# Patient Record
Sex: Male | Born: 1955 | Race: White | Hispanic: No | Marital: Married | Smoking: Former smoker
Health system: Southern US, Community
[De-identification: ages and names within clinical notes are randomized; demographics above are authoritative.]

## PROBLEM LIST (undated history)

## (undated) DIAGNOSIS — D649 Anemia, unspecified: Secondary | ICD-10-CM

## (undated) DIAGNOSIS — K219 Gastro-esophageal reflux disease without esophagitis: Secondary | ICD-10-CM

## (undated) DIAGNOSIS — B999 Unspecified infectious disease: Secondary | ICD-10-CM

## (undated) HISTORY — PX: OTHER SURGICAL HISTORY: SHX169

## (undated) HISTORY — PX: EYE SURGERY: SHX253

---

## 2013-11-07 ENCOUNTER — Emergency Department (HOSPITAL_COMMUNITY): Payer: BC Managed Care – PPO

## 2013-11-07 ENCOUNTER — Inpatient Hospital Stay (HOSPITAL_COMMUNITY)
Admission: EM | Admit: 2013-11-07 | Discharge: 2013-11-15 | DRG: 964 | Disposition: A | Discharge status: Skilled Nursing Facility | Payer: BC Managed Care – PPO | Attending: General Surgery | Admitting: General Surgery

## 2013-11-07 ENCOUNTER — Encounter (HOSPITAL_COMMUNITY): Payer: Self-pay | Admitting: Emergency Medicine

## 2013-11-07 DIAGNOSIS — D62 Acute posthemorrhagic anemia: Secondary | ICD-10-CM | POA: Diagnosis not present

## 2013-11-07 DIAGNOSIS — K560 Paralytic ileus: Secondary | ICD-10-CM | POA: Diagnosis not present

## 2013-11-07 DIAGNOSIS — S0100XA Unspecified open wound of scalp, initial encounter: Secondary | ICD-10-CM | POA: Diagnosis present

## 2013-11-07 DIAGNOSIS — S62319A Displaced fracture of base of unspecified metacarpal bone, initial encounter for closed fracture: Secondary | ICD-10-CM | POA: Diagnosis present

## 2013-11-07 DIAGNOSIS — S42401A Unspecified fracture of lower end of right humerus, initial encounter for closed fracture: Secondary | ICD-10-CM | POA: Diagnosis present

## 2013-11-07 DIAGNOSIS — S61411A Laceration without foreign body of right hand, initial encounter: Secondary | ICD-10-CM | POA: Diagnosis present

## 2013-11-07 DIAGNOSIS — S32402A Unspecified fracture of left acetabulum, initial encounter for closed fracture: Secondary | ICD-10-CM

## 2013-11-07 DIAGNOSIS — Z87891 Personal history of nicotine dependence: Secondary | ICD-10-CM

## 2013-11-07 DIAGNOSIS — S01309A Unspecified open wound of unspecified ear, initial encounter: Secondary | ICD-10-CM | POA: Diagnosis present

## 2013-11-07 DIAGNOSIS — S060X9A Concussion with loss of consciousness of unspecified duration, initial encounter: Secondary | ICD-10-CM | POA: Diagnosis present

## 2013-11-07 DIAGNOSIS — S91309A Unspecified open wound, unspecified foot, initial encounter: Secondary | ICD-10-CM | POA: Diagnosis present

## 2013-11-07 DIAGNOSIS — S52123A Displaced fracture of head of unspecified radius, initial encounter for closed fracture: Secondary | ICD-10-CM | POA: Diagnosis present

## 2013-11-07 DIAGNOSIS — T07XXXA Unspecified multiple injuries, initial encounter: Secondary | ICD-10-CM

## 2013-11-07 DIAGNOSIS — S61209A Unspecified open wound of unspecified finger without damage to nail, initial encounter: Secondary | ICD-10-CM | POA: Diagnosis present

## 2013-11-07 DIAGNOSIS — S2231XA Fracture of one rib, right side, initial encounter for closed fracture: Secondary | ICD-10-CM | POA: Diagnosis present

## 2013-11-07 DIAGNOSIS — S32409A Unspecified fracture of unspecified acetabulum, initial encounter for closed fracture: Principal | ICD-10-CM | POA: Diagnosis present

## 2013-11-07 DIAGNOSIS — F411 Generalized anxiety disorder: Secondary | ICD-10-CM | POA: Diagnosis not present

## 2013-11-07 DIAGNOSIS — S060XAA Concussion with loss of consciousness status unknown, initial encounter: Secondary | ICD-10-CM | POA: Diagnosis present

## 2013-11-07 DIAGNOSIS — E875 Hyperkalemia: Secondary | ICD-10-CM | POA: Diagnosis present

## 2013-11-07 DIAGNOSIS — S62306A Unspecified fracture of fifth metacarpal bone, right hand, initial encounter for closed fracture: Secondary | ICD-10-CM | POA: Diagnosis present

## 2013-11-07 DIAGNOSIS — S270XXA Traumatic pneumothorax, initial encounter: Secondary | ICD-10-CM | POA: Diagnosis present

## 2013-11-07 DIAGNOSIS — S2241XA Multiple fractures of ribs, right side, initial encounter for closed fracture: Secondary | ICD-10-CM

## 2013-11-07 DIAGNOSIS — T148XXA Other injury of unspecified body region, initial encounter: Secondary | ICD-10-CM

## 2013-11-07 DIAGNOSIS — Z800 Family history of malignant neoplasm of digestive organs: Secondary | ICD-10-CM

## 2013-11-07 DIAGNOSIS — S52043A Displaced fracture of coronoid process of unspecified ulna, initial encounter for closed fracture: Secondary | ICD-10-CM | POA: Diagnosis present

## 2013-11-07 DIAGNOSIS — S92009A Unspecified fracture of unspecified calcaneus, initial encounter for closed fracture: Secondary | ICD-10-CM | POA: Diagnosis present

## 2013-11-07 DIAGNOSIS — S0101XA Laceration without foreign body of scalp, initial encounter: Secondary | ICD-10-CM | POA: Diagnosis present

## 2013-11-07 DIAGNOSIS — S42209A Unspecified fracture of upper end of unspecified humerus, initial encounter for closed fracture: Secondary | ICD-10-CM | POA: Diagnosis present

## 2013-11-07 DIAGNOSIS — S92001A Unspecified fracture of right calcaneus, initial encounter for closed fracture: Secondary | ICD-10-CM | POA: Diagnosis present

## 2013-11-07 DIAGNOSIS — S91312A Laceration without foreign body, left foot, initial encounter: Secondary | ICD-10-CM | POA: Diagnosis present

## 2013-11-07 DIAGNOSIS — R000 Tachycardia, unspecified: Secondary | ICD-10-CM | POA: Diagnosis not present

## 2013-11-07 DIAGNOSIS — S2239XA Fracture of one rib, unspecified side, initial encounter for closed fracture: Secondary | ICD-10-CM | POA: Diagnosis present

## 2013-11-07 DIAGNOSIS — S73005A Unspecified dislocation of left hip, initial encounter: Secondary | ICD-10-CM

## 2013-11-07 HISTORY — DX: Gastro-esophageal reflux disease without esophagitis: K21.9

## 2013-11-07 HISTORY — DX: Unspecified infectious disease: B99.9

## 2013-11-07 LAB — I-STAT CHEM 8, ED
BUN: 13 mg/dL (ref 6–23)
CALCIUM ION: 1.11 mmol/L — AB (ref 1.12–1.23)
CREATININE: 1.4 mg/dL — AB (ref 0.50–1.35)
Chloride: 103 mEq/L (ref 96–112)
GLUCOSE: 229 mg/dL — AB (ref 70–99)
HCT: 45 % (ref 39.0–52.0)
HEMOGLOBIN: 15.3 g/dL (ref 13.0–17.0)
Potassium: 3.2 mEq/L — ABNORMAL LOW (ref 3.7–5.3)
SODIUM: 136 meq/L — AB (ref 137–147)
TCO2: 20 mmol/L (ref 0–100)

## 2013-11-07 LAB — CBC
HEMATOCRIT: 42.9 % (ref 39.0–52.0)
Hemoglobin: 15.2 g/dL (ref 13.0–17.0)
MCH: 31.5 pg (ref 26.0–34.0)
MCHC: 35.4 g/dL (ref 30.0–36.0)
MCV: 88.8 fL (ref 78.0–100.0)
PLATELETS: 277 10*3/uL (ref 150–400)
RBC: 4.83 MIL/uL (ref 4.22–5.81)
RDW: 12.9 % (ref 11.5–15.5)
WBC: 16.5 10*3/uL — AB (ref 4.0–10.5)

## 2013-11-07 LAB — PROTIME-INR
INR: 1.02 (ref 0.00–1.49)
Prothrombin Time: 13.4 seconds (ref 11.6–15.2)

## 2013-11-07 LAB — SAMPLE TO BLOOD BANK

## 2013-11-07 LAB — COMPREHENSIVE METABOLIC PANEL
ALBUMIN: 3.9 g/dL (ref 3.5–5.2)
ALT: 75 U/L — ABNORMAL HIGH (ref 0–53)
ANION GAP: 23 — AB (ref 5–15)
AST: 80 U/L — ABNORMAL HIGH (ref 0–37)
Alkaline Phosphatase: 92 U/L (ref 39–117)
BILIRUBIN TOTAL: 0.4 mg/dL (ref 0.3–1.2)
BUN: 13 mg/dL (ref 6–23)
CHLORIDE: 97 meq/L (ref 96–112)
CO2: 17 mEq/L — ABNORMAL LOW (ref 19–32)
CREATININE: 1.32 mg/dL (ref 0.50–1.35)
Calcium: 9.6 mg/dL (ref 8.4–10.5)
GFR calc Af Amer: 67 mL/min — ABNORMAL LOW (ref >90)
GFR calc non Af Amer: 57 mL/min — ABNORMAL LOW (ref >90)
GLUCOSE: 222 mg/dL — AB (ref 70–99)
Potassium: 3.4 mEq/L — ABNORMAL LOW (ref 3.7–5.3)
Sodium: 137 mEq/L (ref 137–147)
TOTAL PROTEIN: 6.9 g/dL (ref 6.0–8.3)

## 2013-11-07 LAB — CBG MONITORING, ED: Glucose-Capillary: 192 mg/dL — ABNORMAL HIGH (ref 70–99)

## 2013-11-07 LAB — CDS SEROLOGY

## 2013-11-07 LAB — ETHANOL: Alcohol, Ethyl (B): <11 mg/dL (ref 0–11)

## 2013-11-07 MED ORDER — ONDANSETRON HCL 4 MG/2ML IJ SOLN
4.0000 mg | Freq: Once | INTRAMUSCULAR | Status: AC
  Administered 2013-11-07: 4 mg via INTRAVENOUS
  Filled 2013-11-07: qty 2

## 2013-11-07 MED ORDER — TETANUS-DIPHTH-ACELL PERTUSSIS 5-2.5-18.5 LF-MCG/0.5 IM SUSP
0.5000 mL | Freq: Once | INTRAMUSCULAR | Status: AC
  Administered 2013-11-07: 0.5 mL via INTRAMUSCULAR
  Filled 2013-11-07: qty 0.5

## 2013-11-07 MED ORDER — IOHEXOL 300 MG/ML  SOLN
100.0000 mL | Freq: Once | INTRAMUSCULAR | Status: AC | PRN
  Administered 2013-11-07: 100 mL via INTRAVENOUS

## 2013-11-07 MED ORDER — HYDROMORPHONE HCL PF 1 MG/ML IJ SOLN
0.5000 mg | Freq: Once | INTRAMUSCULAR | Status: AC
  Administered 2013-11-07: 0.5 mg via INTRAVENOUS
  Filled 2013-11-07: qty 1

## 2013-11-07 MED ORDER — HYDROMORPHONE HCL PF 1 MG/ML IJ SOLN
1.0000 mg | Freq: Once | INTRAMUSCULAR | Status: AC
  Administered 2013-11-07: 1 mg via INTRAVENOUS
  Filled 2013-11-07: qty 1

## 2013-11-07 MED ORDER — FENTANYL CITRATE 0.05 MG/ML IJ SOLN
100.0000 ug | Freq: Once | INTRAMUSCULAR | Status: AC
  Administered 2013-11-07: 100 ug via INTRAVENOUS
  Filled 2013-11-07: qty 2
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
## 2013-11-07 NOTE — ED Notes (Signed)
Pt placed on 2L [place] due to O2 sats dropping to 88% on RA.

## 2013-11-07 NOTE — Progress Notes (Signed)
Chaplain responded to trauma page. Pt alert and responsive and more coherent as conversation progressed. Asked Chaplain to notify daughter Toniann FailWendy and gentleman named Air cabin crewMatt [place] [employer]. Toniann FailWendy was reached and is making her way. Matt could not be reached. Concerned for his wife, Chaplain relayed messages between he and his wife. He was grateful for that action.  Gala RomneyLarry J Brown

## 2013-11-07 NOTE — ED Notes (Signed)
EDP at bedside suturing  
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
## 2013-11-07 NOTE — ED Notes (Signed)
Portable at bedside for repeat portable pelvis Xray.
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
## 2013-11-07 NOTE — ED Notes (Signed)
Old blood bank bracelet cut off due to arm needing to be splinted.  New blood bank bracelet placed, new blood tube drawn and pink slip filled out.  Blood bank notified.

## 2013-11-07 NOTE — ED Notes (Signed)
Traction applied to pt by Dr. Eulah PontMurphy.

## 2013-11-07 NOTE — ED Notes (Signed)
Suture cart at bedside 

## 2013-11-07 NOTE — Progress Notes (Signed)
Orthopedic Tech Progress Note Patient Details:  Ria BushKenneth Neely 08/16/1955 [id number]  Musculoskeletal Traction Type of Traction: Skeletal (Balanced Suspension) Traction Location: LLE Traction Weight: 20 lbs    Jennye MoccasinHughes, Kyvon Hu Craig 11/07/2013, 11:01 PM

## 2013-11-07 NOTE — ED Provider Notes (Signed)
LACERATION REPAIR Performed by: Dorthula MatasGREENE,Pascual Mantel G Authorized by: Dorthula MatasGREENE,Dalvin Clipper G Consent: Verbal consent obtained. Risks and benefits: risks, benefits and alternatives were discussed Consent given by: patient Patient identity confirmed: provided demographic data Prepped and Draped in normal sterile fashion Wound explored  Laceration Location: scalp  Laceration Length: 1.5 cm  No Foreign Bodies seen or palpated  Anesthesia: local infiltration  Local anesthetic: lidocaine 2% with epinephrine  Anesthetic total: 1.5 ml  Irrigation method: syringe Amount of cleaning: standard  Skin closure: staples  Number of sutures: 3  Technique: staples  Patient tolerance: Patient tolerated the procedure well with no immediate complications.  LACERATION REPAIR Performed by: Dorthula MatasGREENE,Julio Zappia G Authorized by: Dorthula MatasGREENE,Dudley Mages G Consent: Verbal consent obtained. Risks and benefits: risks, benefits and alternatives were discussed Consent given by: patient Patient identity confirmed: provided demographic data Prepped and Draped in normal sterile fashion Wound explored  Laceration Location: left foot  Laceration Length:  1.5 cm  No Foreign Bodies seen or palpated  Anesthesia: local infiltration  Local anesthetic: lidocaine 2  % with epinephrine  Anesthetic total: 1.5  ml  Irrigation method: syringe Amount of cleaning: standard  Skin closure: sutures  Number of sutures: 3   Technique: simple interrupted  Patient tolerance: Patient tolerated the procedure well with no immediate complications.  For HPI, ROS, PE, and Dispo of this visit please refer to Dr. Exie ParodyScott Goldstons note.  Dorthula Matasiffany G Sakai Heinle, PA-C 11/07/13 2358

## 2013-11-08 ENCOUNTER — Inpatient Hospital Stay (HOSPITAL_COMMUNITY): Payer: BC Managed Care – PPO

## 2013-11-08 ENCOUNTER — Encounter (HOSPITAL_COMMUNITY): Payer: Self-pay | Admitting: General Practice

## 2013-11-08 ENCOUNTER — Encounter (HOSPITAL_COMMUNITY): Payer: BC Managed Care – PPO | Admitting: Certified Registered Nurse Anesthetist

## 2013-11-08 ENCOUNTER — Inpatient Hospital Stay (HOSPITAL_COMMUNITY): Payer: BC Managed Care – PPO | Admitting: Certified Registered Nurse Anesthetist

## 2013-11-08 DIAGNOSIS — S62319A Displaced fracture of base of unspecified metacarpal bone, initial encounter for closed fracture: Secondary | ICD-10-CM | POA: Diagnosis present

## 2013-11-08 DIAGNOSIS — S52043A Displaced fracture of coronoid process of unspecified ulna, initial encounter for closed fracture: Secondary | ICD-10-CM | POA: Diagnosis present

## 2013-11-08 DIAGNOSIS — S32402A Unspecified fracture of left acetabulum, initial encounter for closed fracture: Secondary | ICD-10-CM | POA: Diagnosis present

## 2013-11-08 DIAGNOSIS — S91309A Unspecified open wound, unspecified foot, initial encounter: Secondary | ICD-10-CM | POA: Diagnosis present

## 2013-11-08 DIAGNOSIS — S2231XA Fracture of one rib, right side, initial encounter for closed fracture: Secondary | ICD-10-CM | POA: Diagnosis present

## 2013-11-08 DIAGNOSIS — R000 Tachycardia, unspecified: Secondary | ICD-10-CM | POA: Diagnosis not present

## 2013-11-08 DIAGNOSIS — S060X9A Concussion with loss of consciousness of unspecified duration, initial encounter: Secondary | ICD-10-CM | POA: Diagnosis present

## 2013-11-08 DIAGNOSIS — S0101XA Laceration without foreign body of scalp, initial encounter: Secondary | ICD-10-CM | POA: Diagnosis present

## 2013-11-08 DIAGNOSIS — S32409A Unspecified fracture of unspecified acetabulum, initial encounter for closed fracture: Secondary | ICD-10-CM | POA: Diagnosis present

## 2013-11-08 DIAGNOSIS — S61411A Laceration without foreign body of right hand, initial encounter: Secondary | ICD-10-CM | POA: Diagnosis present

## 2013-11-08 DIAGNOSIS — S52123A Displaced fracture of head of unspecified radius, initial encounter for closed fracture: Secondary | ICD-10-CM | POA: Diagnosis present

## 2013-11-08 DIAGNOSIS — S01309A Unspecified open wound of unspecified ear, initial encounter: Secondary | ICD-10-CM | POA: Diagnosis present

## 2013-11-08 DIAGNOSIS — S62306A Unspecified fracture of fifth metacarpal bone, right hand, initial encounter for closed fracture: Secondary | ICD-10-CM | POA: Diagnosis present

## 2013-11-08 DIAGNOSIS — F411 Generalized anxiety disorder: Secondary | ICD-10-CM | POA: Diagnosis not present

## 2013-11-08 DIAGNOSIS — S61209A Unspecified open wound of unspecified finger without damage to nail, initial encounter: Secondary | ICD-10-CM | POA: Diagnosis present

## 2013-11-08 DIAGNOSIS — S2239XA Fracture of one rib, unspecified side, initial encounter for closed fracture: Secondary | ICD-10-CM

## 2013-11-08 DIAGNOSIS — S0100XA Unspecified open wound of scalp, initial encounter: Secondary | ICD-10-CM | POA: Diagnosis present

## 2013-11-08 DIAGNOSIS — S91312A Laceration without foreign body, left foot, initial encounter: Secondary | ICD-10-CM | POA: Diagnosis present

## 2013-11-08 DIAGNOSIS — S270XXA Traumatic pneumothorax, initial encounter: Secondary | ICD-10-CM | POA: Diagnosis present

## 2013-11-08 DIAGNOSIS — S0180XA Unspecified open wound of other part of head, initial encounter: Secondary | ICD-10-CM

## 2013-11-08 DIAGNOSIS — S060XAA Concussion with loss of consciousness status unknown, initial encounter: Secondary | ICD-10-CM | POA: Diagnosis present

## 2013-11-08 DIAGNOSIS — D62 Acute posthemorrhagic anemia: Secondary | ICD-10-CM | POA: Diagnosis not present

## 2013-11-08 DIAGNOSIS — Z800 Family history of malignant neoplasm of digestive organs: Secondary | ICD-10-CM | POA: Diagnosis not present

## 2013-11-08 DIAGNOSIS — Z87891 Personal history of nicotine dependence: Secondary | ICD-10-CM | POA: Diagnosis not present

## 2013-11-08 DIAGNOSIS — S42209A Unspecified fracture of upper end of unspecified humerus, initial encounter for closed fracture: Secondary | ICD-10-CM | POA: Diagnosis present

## 2013-11-08 DIAGNOSIS — E875 Hyperkalemia: Secondary | ICD-10-CM | POA: Diagnosis present

## 2013-11-08 DIAGNOSIS — T148XXA Other injury of unspecified body region, initial encounter: Secondary | ICD-10-CM | POA: Diagnosis present

## 2013-11-08 DIAGNOSIS — S92009A Unspecified fracture of unspecified calcaneus, initial encounter for closed fracture: Secondary | ICD-10-CM | POA: Diagnosis present

## 2013-11-08 DIAGNOSIS — K560 Paralytic ileus: Secondary | ICD-10-CM | POA: Diagnosis not present

## 2013-11-08 LAB — CBC
HCT: 40.6 % (ref 39.0–52.0)
Hemoglobin: 14.1 g/dL (ref 13.0–17.0)
MCH: 30.2 pg (ref 26.0–34.0)
MCHC: 34.7 g/dL (ref 30.0–36.0)
MCV: 86.9 fL (ref 78.0–100.0)
PLATELETS: 218 10*3/uL (ref 150–400)
RBC: 4.67 MIL/uL (ref 4.22–5.81)
RDW: 13.1 % (ref 11.5–15.5)
WBC: 14.5 10*3/uL — AB (ref 4.0–10.5)

## 2013-11-08 LAB — URINALYSIS, ROUTINE W REFLEX MICROSCOPIC
BILIRUBIN URINE: NEGATIVE
GLUCOSE, UA: NEGATIVE mg/dL
Ketones, ur: NEGATIVE mg/dL
Leukocytes, UA: NEGATIVE
NITRITE: NEGATIVE
PH: 5.5 (ref 5.0–8.0)
Protein, ur: NEGATIVE mg/dL
SPECIFIC GRAVITY, URINE: 1.03 (ref 1.005–1.030)
Urobilinogen, UA: 0.2 mg/dL (ref 0.0–1.0)

## 2013-11-08 LAB — BASIC METABOLIC PANEL
ANION GAP: 18 — AB (ref 5–15)
Anion gap: 15 (ref 5–15)
Anion gap: 14 (ref 5–15)
BUN: 18 mg/dL (ref 6–23)
BUN: 20 mg/dL (ref 6–23)
BUN: 18 mg/dL (ref 6–23)
CALCIUM: 8.5 mg/dL (ref 8.4–10.5)
CALCIUM: 8.9 mg/dL (ref 8.4–10.5)
CHLORIDE: 102 meq/L (ref 96–112)
CO2: 19 mEq/L (ref 19–32)
CO2: 15 mEq/L — ABNORMAL LOW (ref 19–32)
CO2: 22 mEq/L (ref 19–32)
CREATININE: 1.17 mg/dL (ref 0.50–1.35)
Calcium: 9.3 mg/dL (ref 8.4–10.5)
Chloride: 103 mEq/L (ref 96–112)
Chloride: 103 mEq/L (ref 96–112)
Creatinine, Ser: 1.34 mg/dL (ref 0.50–1.35)
Creatinine, Ser: 1.12 mg/dL (ref 0.50–1.35)
GFR calc Af Amer: 65 mL/min — ABNORMAL LOW (ref >90)
GFR calc Af Amer: 77 mL/min — ABNORMAL LOW (ref >90)
GFR calc Af Amer: 81 mL/min — ABNORMAL LOW (ref >90)
GFR, EST NON AFRICAN AMERICAN: 56 mL/min — AB (ref >90)
GFR, EST NON AFRICAN AMERICAN: 67 mL/min — AB (ref >90)
GFR, EST NON AFRICAN AMERICAN: 70 mL/min — AB (ref >90)
GLUCOSE: 169 mg/dL — AB (ref 70–99)
Glucose, Bld: 152 mg/dL — ABNORMAL HIGH (ref 70–99)
Glucose, Bld: 85 mg/dL (ref 70–99)
POTASSIUM: 6 meq/L — AB (ref 3.7–5.3)
Potassium: 6.2 mEq/L — ABNORMAL HIGH (ref 3.7–5.3)
Potassium: 4.1 mEq/L (ref 3.7–5.3)
SODIUM: 136 meq/L — AB (ref 137–147)
SODIUM: 139 meq/L (ref 137–147)
Sodium: 136 mEq/L — ABNORMAL LOW (ref 137–147)

## 2013-11-08 LAB — GLUCOSE, CAPILLARY: Glucose-Capillary: 115 mg/dL — ABNORMAL HIGH (ref 70–99)

## 2013-11-08 LAB — URINE MICROSCOPIC-ADD ON

## 2013-11-08 MED ORDER — POTASSIUM CHLORIDE IN NACL 20-0.9 MEQ/L-% IV SOLN
INTRAVENOUS | Status: DC
  Administered 2013-11-08: 04:00:00 via INTRAVENOUS
  Filled 2013-11-08 (×3): qty 1000

## 2013-11-08 MED ORDER — HYDROMORPHONE HCL PF 1 MG/ML IJ SOLN
1.0000 mg | INTRAMUSCULAR | Status: DC | PRN
  Administered 2013-11-08 – 2013-11-11 (×4): 2 mg via INTRAVENOUS
  Administered 2013-11-13 (×2): 1 mg via INTRAVENOUS
  Filled 2013-11-08: qty 1
  Filled 2013-11-08 (×4): qty 2

## 2013-11-08 MED ORDER — MIDAZOLAM HCL 2 MG/2ML IJ SOLN
INTRAMUSCULAR | Status: AC
  Filled 2013-11-08: qty 2

## 2013-11-08 MED ORDER — MIDAZOLAM HCL 2 MG/2ML IJ SOLN
INTRAMUSCULAR | Status: DC | PRN
  Administered 2013-11-08 (×2): 1 mg via INTRAVENOUS

## 2013-11-08 MED ORDER — FENTANYL CITRATE 0.05 MG/ML IJ SOLN
INTRAMUSCULAR | Status: AC
  Filled 2013-11-08: qty 2

## 2013-11-08 MED ORDER — LIDOCAINE HCL (CARDIAC) 20 MG/ML IV SOLN
INTRAVENOUS | Status: AC
  Filled 2013-11-08: qty 5

## 2013-11-08 MED ORDER — METHOCARBAMOL 500 MG PO TABS
500.0000 mg | ORAL_TABLET | Freq: Four times a day (QID) | ORAL | Status: DC | PRN
  Administered 2013-11-08 (×2): 1000 mg via ORAL
  Filled 2013-11-08 (×2): qty 2

## 2013-11-08 MED ORDER — SODIUM CHLORIDE 0.9 % IJ SOLN: 9.0000 mL | INTRAMUSCULAR | Status: DC | PRN

## 2013-11-08 MED ORDER — PANTOPRAZOLE SODIUM 40 MG IV SOLR
40.0000 mg | Freq: Every day | INTRAVENOUS | Status: DC
  Filled 2013-11-08: qty 40

## 2013-11-08 MED ORDER — ONDANSETRON HCL 4 MG/2ML IJ SOLN
4.0000 mg | Freq: Four times a day (QID) | INTRAMUSCULAR | Status: DC | PRN
  Administered 2013-11-08: 4 mg via INTRAVENOUS
  Filled 2013-11-08 (×2): qty 2

## 2013-11-08 MED ORDER — MORPHINE SULFATE 2 MG/ML IJ SOLN
2.0000 mg | INTRAMUSCULAR | Status: DC | PRN
  Administered 2013-11-08 – 2013-11-12 (×2): 2 mg via INTRAVENOUS
  Filled 2013-11-08 (×2): qty 1

## 2013-11-08 MED ORDER — POLYETHYLENE GLYCOL 3350 17 G PO PACK
17.0000 g | PACK | Freq: Every day | ORAL | Status: DC
  Administered 2013-11-08 – 2013-11-12 (×4): 17 g via ORAL
  Filled 2013-11-08 (×9): qty 1

## 2013-11-08 MED ORDER — SULFAMETHOXAZOLE-TMP DS 800-160 MG PO TABS
1.0000 | ORAL_TABLET | Freq: Two times a day (BID) | ORAL | Status: DC
  Administered 2013-11-08 – 2013-11-15 (×12): 1 via ORAL
  Filled 2013-11-08 (×17): qty 1

## 2013-11-08 MED ORDER — MEPERIDINE HCL 25 MG/ML IJ SOLN: 6.2500 mg | INTRAMUSCULAR | Status: DC | PRN

## 2013-11-08 MED ORDER — INSULIN ASPART 100 UNIT/ML IV SOLN
10.0000 [IU] | Freq: Once | INTRAVENOUS | Status: AC
  Administered 2013-11-08: 10 [IU] via INTRAVENOUS
  Filled 2013-11-08: qty 0.1

## 2013-11-08 MED ORDER — PROPOFOL 10 MG/ML IV BOLUS
INTRAVENOUS | Status: AC
  Filled 2013-11-08: qty 20

## 2013-11-08 MED ORDER — OXYCODONE HCL 5 MG/5ML PO SOLN
5.0000 mg | Freq: Once | ORAL | Status: DC | PRN
Start: 2013-11-08 — End: 2013-11-08

## 2013-11-08 MED ORDER — FENTANYL CITRATE 0.05 MG/ML IJ SOLN
100.0000 ug | Freq: Once | INTRAMUSCULAR | Status: AC
  Administered 2013-11-08: 100 ug via INTRAVENOUS

## 2013-11-08 MED ORDER — PROMETHAZINE HCL 25 MG/ML IJ SOLN: 6.2500 mg | INTRAMUSCULAR | Status: DC | PRN

## 2013-11-08 MED ORDER — DIPHENHYDRAMINE HCL 12.5 MG/5ML PO ELIX
12.5000 mg | ORAL_SOLUTION | Freq: Four times a day (QID) | ORAL | Status: DC | PRN
  Filled 2013-11-08: qty 5

## 2013-11-08 MED ORDER — ENOXAPARIN SODIUM 40 MG/0.4ML ~~LOC~~ SOLN
40.0000 mg | SUBCUTANEOUS | Status: DC
  Administered 2013-11-10 – 2013-11-14 (×5): 40 mg via SUBCUTANEOUS
  Filled 2013-11-08 (×8): qty 0.4

## 2013-11-08 MED ORDER — FENTANYL 10 MCG/ML IV SOLN
INTRAVENOUS | Status: DC
  Administered 2013-11-08: 75 ug via INTRAVENOUS
  Administered 2013-11-08: 15:00:00 via INTRAVENOUS
  Administered 2013-11-08: 120 ug via INTRAVENOUS
  Administered 2013-11-09 (×2): via INTRAVENOUS
  Administered 2013-11-09: 165 ug via INTRAVENOUS
  Administered 2013-11-10: 45 ug via INTRAVENOUS
  Administered 2013-11-10: 90 ug via INTRAVENOUS
  Administered 2013-11-10: 30 ug via INTRAVENOUS
  Administered 2013-11-10: 75 ug via INTRAVENOUS
  Filled 2013-11-08 (×2): qty 50

## 2013-11-08 MED ORDER — ROCURONIUM BROMIDE 50 MG/5ML IV SOLN
INTRAVENOUS | Status: AC
  Filled 2013-11-08: qty 1

## 2013-11-08 MED ORDER — HYDROMORPHONE HCL PF 1 MG/ML IJ SOLN: 0.2500 mg | INTRAMUSCULAR | Status: DC | PRN

## 2013-11-08 MED ORDER — HYDROMORPHONE HCL PF 1 MG/ML IJ SOLN
1.0000 mg | INTRAMUSCULAR | Status: DC | PRN
  Administered 2013-11-08 (×3): 1 mg via INTRAVENOUS
  Filled 2013-11-08 (×3): qty 1

## 2013-11-08 MED ORDER — BACITRACIN ZINC 500 UNIT/GM EX OINT
TOPICAL_OINTMENT | Freq: Two times a day (BID) | CUTANEOUS | Status: DC
  Administered 2013-11-08 – 2013-11-11 (×6): via TOPICAL
  Administered 2013-11-12: 1 via TOPICAL
  Administered 2013-11-12 – 2013-11-15 (×5): via TOPICAL
  Filled 2013-11-08 (×2): qty 28.35

## 2013-11-08 MED ORDER — DIAZEPAM 5 MG/ML IJ SOLN
INTRAMUSCULAR | Status: AC
  Filled 2013-11-08: qty 2

## 2013-11-08 MED ORDER — FENTANYL CITRATE 0.05 MG/ML IJ SOLN
25.0000 ug | Freq: Once | INTRAMUSCULAR | Status: AC
  Administered 2013-11-08: 25 ug via INTRAVENOUS

## 2013-11-08 MED ORDER — DIAZEPAM 5 MG/ML IJ SOLN
10.0000 mg | Freq: Four times a day (QID) | INTRAMUSCULAR | Status: DC | PRN
  Administered 2013-11-08: 10 mg via INTRAVENOUS

## 2013-11-08 MED ORDER — NALOXONE HCL 0.4 MG/ML IJ SOLN: 0.4000 mg | INTRAMUSCULAR | Status: DC | PRN

## 2013-11-08 MED ORDER — HYDROMORPHONE HCL PF 1 MG/ML IJ SOLN
INTRAMUSCULAR | Status: AC
  Filled 2013-11-08: qty 2

## 2013-11-08 MED ORDER — FENTANYL CITRATE 0.05 MG/ML IJ SOLN
INTRAMUSCULAR | Status: DC | PRN
  Administered 2013-11-08: 50 ug via INTRAVENOUS

## 2013-11-08 MED ORDER — DOCUSATE SODIUM 100 MG PO CAPS
100.0000 mg | ORAL_CAPSULE | Freq: Two times a day (BID) | ORAL | Status: DC
  Administered 2013-11-08 – 2013-11-14 (×9): 100 mg via ORAL
  Filled 2013-11-08 (×17): qty 1

## 2013-11-08 MED ORDER — LACTATED RINGERS IV SOLN
INTRAVENOUS | Status: DC
  Administered 2013-11-08 (×2): via INTRAVENOUS
  Administered 2013-11-10: 50 mL/h via INTRAVENOUS

## 2013-11-08 MED ORDER — ONDANSETRON HCL 4 MG/2ML IJ SOLN
INTRAMUSCULAR | Status: AC
  Filled 2013-11-08: qty 2

## 2013-11-08 MED ORDER — ONDANSETRON HCL 4 MG PO TABS
4.0000 mg | ORAL_TABLET | Freq: Four times a day (QID) | ORAL | Status: DC | PRN
  Administered 2013-11-12: 4 mg via ORAL
  Filled 2013-11-08 (×2): qty 1

## 2013-11-08 MED ORDER — METHOCARBAMOL 1000 MG/10ML IJ SOLN
500.0000 mg | Freq: Four times a day (QID) | INTRAVENOUS | Status: DC | PRN
  Filled 2013-11-08: qty 10

## 2013-11-08 MED ORDER — FENTANYL CITRATE 0.05 MG/ML IJ SOLN
INTRAMUSCULAR | Status: AC
  Filled 2013-11-08: qty 5

## 2013-11-08 MED ORDER — CIPROFLOXACIN HCL 500 MG PO TABS
500.0000 mg | ORAL_TABLET | Freq: Two times a day (BID) | ORAL | Status: DC
  Administered 2013-11-08 – 2013-11-15 (×13): 500 mg via ORAL
  Filled 2013-11-08 (×18): qty 1

## 2013-11-08 MED ORDER — DIAZEPAM 5 MG/ML IJ SOLN
5.0000 mg | Freq: Four times a day (QID) | INTRAMUSCULAR | Status: DC | PRN
  Administered 2013-11-08: 5 mg via INTRAVENOUS
  Administered 2013-11-09 – 2013-11-13 (×5): 10 mg via INTRAVENOUS
  Filled 2013-11-08 (×7): qty 2

## 2013-11-08 MED ORDER — SODIUM CHLORIDE 0.9 % IV SOLN
1.0000 g | Freq: Once | INTRAVENOUS | Status: AC
  Administered 2013-11-08: 1 g via INTRAVENOUS
  Filled 2013-11-08: qty 10

## 2013-11-08 MED ORDER — DEXTROSE 10 % IV BOLUS
500.0000 mL | Freq: Once | INTRAVENOUS | Status: AC
  Administered 2013-11-08: 15:00:00 via INTRAVENOUS

## 2013-11-08 MED ORDER — PANTOPRAZOLE SODIUM 40 MG PO TBEC: 40.0000 mg | DELAYED_RELEASE_TABLET | Freq: Every day | ORAL | Status: DC

## 2013-11-08 MED ORDER — OXYCODONE HCL 5 MG PO TABS: 5.0000 mg | ORAL_TABLET | Freq: Once | ORAL | Status: DC | PRN

## 2013-11-08 MED ORDER — DIPHENHYDRAMINE HCL 50 MG/ML IJ SOLN
12.5000 mg | Freq: Four times a day (QID) | INTRAMUSCULAR | Status: DC | PRN
  Administered 2013-11-10: 12.5 mg via INTRAVENOUS
  Filled 2013-11-08: qty 1

## 2013-11-08 MED ORDER — OXYCODONE HCL 5 MG PO TABS
5.0000 mg | ORAL_TABLET | ORAL | Status: DC | PRN
  Administered 2013-11-08: 10 mg via ORAL
  Administered 2013-11-09: 5 mg via ORAL
  Administered 2013-11-11 – 2013-11-15 (×12): 15 mg via ORAL
  Administered 2013-11-15: 10 mg via ORAL
  Filled 2013-11-08 (×3): qty 3
  Filled 2013-11-08: qty 1
  Filled 2013-11-08 (×6): qty 3
  Filled 2013-11-08: qty 2
  Filled 2013-11-08: qty 3
  Filled 2013-11-08: qty 2
  Filled 2013-11-08 (×2): qty 3

## 2013-11-08 NOTE — ED Notes (Signed)
Dr Wyatt at bedside.  
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
## 2013-11-08 NOTE — Consult Note (Signed)
  Patient has minimally displaced right small metacarpal fracture  No surgical intervention needed at this time  Please place in ulnar gutter splint with follow up in my office in 7-10 days

## 2013-11-08 NOTE — ED Provider Notes (Signed)
I was present for the lacerations repaired by PA Tiffany Derek [place]  Wardell Pokorski T Khila Papp, MD 11/08/13 [phone]
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
## 2013-11-08 NOTE — Anesthesia Preprocedure Evaluation (Addendum)
Anesthesia Evaluation  Patient identified by MRN, date of birth, ID band Patient awake    Reviewed: Allergy & Precautions, H&P , NPO status , Patient's Chart, lab work & pertinent test results  Airway Mallampati: II TM Distance: >3 FB Neck ROM: Full    Dental no notable dental hx.    Pulmonary former smoker,  breath sounds clear to auscultation  Pulmonary exam normal       Cardiovascular negative cardio ROS  Rhythm:Regular Rate:Normal     Neuro/Psych CT head and neck negative for intracranial or cervical injury negative neurological ROS  negative psych ROS   GI/Hepatic Neg liver ROS, GERD-  Medicated,  Endo/Other  diabetes, Type 2Possibly stress related. Glc 220s  Renal/GU negative Renal ROS     Musculoskeletal negative musculoskeletal ROS (+)   Abdominal   Peds  Hematology negative hematology ROS (+)   Anesthesia Other Findings   Reproductive/Obstetrics                       Anesthesia Physical Anesthesia Plan  ASA: II  Anesthesia Plan: General   Post-op Pain Management:    Induction: Intravenous  Airway Management Planned: Oral ETT  Additional Equipment:   Intra-op Plan:   Post-operative Plan: Extubation in OR  Informed Consent: I have reviewed the patients History and Physical, chart, labs and discussed the procedure including the risks, benefits and alternatives for the proposed anesthesia with the patient or authorized representative who has indicated his/her understanding and acceptance.   Dental advisory given  Plan Discussed with: CRNA  Anesthesia Plan Comments:        Anesthesia Quick Evaluation

## 2013-11-08 NOTE — Progress Notes (Addendum)
Pt. Cancelled per Dr. Carola FrostHandy due to elevated K.  Calcium started per Collie SiadM. McMillan, CRNA.  [facility].  Pt transferred on bed with traction in place to Left leg.  Cardiac monitor in place during Short stay admission and during transport.
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
## 2013-11-08 NOTE — Consult Note (Signed)
I have seen and examined the patient. I agree with the findings above.  Left acetabular fracture with persistent dislocation secondary to incarcerated fragment LLE with intact sens and motor function of sciatic  Given the complexity of the fracture pattern, Dr. Eulah PontMurphy asserted this was outside his scope of practice and that it would be in the best interest of the patient to have these injuries evaluated and treated by a fellowship trained orthopaedic traumatologist.  I discussed with the patient and his daughter the risks and benefits of surgery, including the possibility of infection, nerve injury, vessel injury, wound breakdown, arthritis, symptomatic hardware, DVT/ PE, loss of motion, and need for further surgery among others.  We also specifically discussed the elevated risk of AVN, HO, instability, and foot drop.  They understood these risks and wished to proceed.    Jerry PalmerHANDY,Jerry Sikora H, MD 11/08/2013 12:31 PM

## 2013-11-08 NOTE — Progress Notes (Signed)
Appreciate Dr. Magdalene PatriciaHandy's help. Repeat K - likely an error. Patient examined and I agree with the assessment and plan  Violeta GelinasBurke Earnestine Shipp, MD, MPH, FACS Trauma: [phone] General Surgery: [phone]  11/08/2013 10:39 AM

## 2013-11-09 ENCOUNTER — Inpatient Hospital Stay (HOSPITAL_COMMUNITY): Payer: BC Managed Care – PPO

## 2013-11-09 ENCOUNTER — Encounter (HOSPITAL_COMMUNITY): Payer: BC Managed Care – PPO

## 2013-11-09 ENCOUNTER — Encounter (HOSPITAL_COMMUNITY): Payer: Self-pay | Admitting: Anesthesiology

## 2013-11-09 ENCOUNTER — Encounter (HOSPITAL_COMMUNITY): Admission: EM | Disposition: A | Discharge status: Skilled Nursing Facility | Payer: Self-pay | Source: Home / Self Care

## 2013-11-09 DIAGNOSIS — D62 Acute posthemorrhagic anemia: Secondary | ICD-10-CM

## 2013-11-09 HISTORY — PX: ORIF ACETABULAR FRACTURE: SHX5029

## 2013-11-09 HISTORY — PX: CAST APPLICATION: SHX380

## 2013-11-09 LAB — BASIC METABOLIC PANEL
Anion gap: 11 (ref 5–15)
Anion gap: 11 (ref 5–15)
BUN: 16 mg/dL (ref 6–23)
BUN: 10 mg/dL (ref 6–23)
CALCIUM: 7.6 mg/dL — AB (ref 8.4–10.5)
CO2: 23 mEq/L (ref 19–32)
CO2: 23 meq/L (ref 19–32)
Calcium: 8.6 mg/dL (ref 8.4–10.5)
Chloride: 102 mEq/L (ref 96–112)
Chloride: 100 mEq/L (ref 96–112)
Creatinine, Ser: 1.1 mg/dL (ref 0.50–1.35)
Creatinine, Ser: 1.06 mg/dL (ref 0.50–1.35)
GFR calc Af Amer: 83 mL/min — ABNORMAL LOW (ref >90)
GFR calc Af Amer: 87 mL/min — ABNORMAL LOW (ref >90)
GFR calc non Af Amer: 72 mL/min — ABNORMAL LOW (ref >90)
GFR calc non Af Amer: 75 mL/min — ABNORMAL LOW (ref >90)
GLUCOSE: 121 mg/dL — AB (ref 70–99)
Glucose, Bld: 115 mg/dL — ABNORMAL HIGH (ref 70–99)
POTASSIUM: 4.3 meq/L (ref 3.7–5.3)
Potassium: 4.6 mEq/L (ref 3.7–5.3)
SODIUM: 134 meq/L — AB (ref 137–147)
Sodium: 136 mEq/L — ABNORMAL LOW (ref 137–147)

## 2013-11-09 LAB — CBC
HCT: 21.7 % — ABNORMAL LOW (ref 39.0–52.0)
HEMATOCRIT: 32.1 % — AB (ref 39.0–52.0)
HEMATOCRIT: 22.6 % — AB (ref 39.0–52.0)
HEMOGLOBIN: 8 g/dL — AB (ref 13.0–17.0)
Hemoglobin: 11.4 g/dL — ABNORMAL LOW (ref 13.0–17.0)
Hemoglobin: 7.8 g/dL — ABNORMAL LOW (ref 13.0–17.0)
MCH: 31.1 pg (ref 26.0–34.0)
MCH: 32 pg (ref 26.0–34.0)
MCH: 30.3 pg (ref 26.0–34.0)
MCHC: 35.5 g/dL (ref 30.0–36.0)
MCHC: 35.9 g/dL (ref 30.0–36.0)
MCHC: 35.4 g/dL (ref 30.0–36.0)
MCV: 87.7 fL (ref 78.0–100.0)
MCV: 88.9 fL (ref 78.0–100.0)
MCV: 85.6 fL (ref 78.0–100.0)
Platelets: 156 10*3/uL (ref 150–400)
Platelets: 150 10*3/uL (ref 150–400)
Platelets: 137 10*3/uL — ABNORMAL LOW (ref 150–400)
RBC: 3.66 MIL/uL — ABNORMAL LOW (ref 4.22–5.81)
RBC: 2.44 MIL/uL — ABNORMAL LOW (ref 4.22–5.81)
RBC: 2.64 MIL/uL — AB (ref 4.22–5.81)
RDW: 13.1 % (ref 11.5–15.5)
RDW: 13 % (ref 11.5–15.5)
RDW: 13 % (ref 11.5–15.5)
WBC: 8.3 10*3/uL (ref 4.0–10.5)
WBC: 7.3 10*3/uL (ref 4.0–10.5)
WBC: 7 10*3/uL (ref 4.0–10.5)

## 2013-11-09 LAB — PROTIME-INR
INR: 1.19 (ref 0.00–1.49)
Prothrombin Time: 15.1 seconds (ref 11.6–15.2)

## 2013-11-09 LAB — ABO/RH: ABO/RH(D): B POS

## 2013-11-09 LAB — PREPARE RBC (CROSSMATCH)

## 2013-11-09 LAB — APTT: aPTT: 28 seconds (ref 24–37)

## 2013-11-09 LAB — MRSA PCR SCREENING: MRSA BY PCR: NEGATIVE

## 2013-11-09 SURGERY — OPEN REDUCTION INTERNAL FIXATION (ORIF) ACETABULAR FRACTURE
Anesthesia: General | Laterality: Left

## 2013-11-09 SURGERY — OPEN REDUCTION INTERNAL FIXATION (ORIF) ACETABULAR FRACTURE
Anesthesia: General | Site: Ankle | Laterality: Right

## 2013-11-09 MED ORDER — ROCURONIUM BROMIDE 50 MG/5ML IV SOLN
INTRAVENOUS | Status: AC
  Filled 2013-11-09: qty 1

## 2013-11-09 MED ORDER — LACTATED RINGERS IV SOLN
INTRAVENOUS | Status: DC | PRN
  Administered 2013-11-09 (×3): via INTRAVENOUS

## 2013-11-09 MED ORDER — SODIUM CHLORIDE 0.9 % IV SOLN
Freq: Once | INTRAVENOUS | Status: AC
  Administered 2013-11-10: via INTRAVENOUS

## 2013-11-09 MED ORDER — ONDANSETRON HCL 4 MG/2ML IJ SOLN
INTRAMUSCULAR | Status: AC
  Filled 2013-11-09: qty 2

## 2013-11-09 MED ORDER — PHENYLEPHRINE HCL 10 MG/ML IJ SOLN
INTRAMUSCULAR | Status: DC | PRN
  Administered 2013-11-09: 80 ug via INTRAVENOUS
  Administered 2013-11-09: 120 ug via INTRAVENOUS

## 2013-11-09 MED ORDER — FENTANYL CITRATE 0.05 MG/ML IJ SOLN
INTRAMUSCULAR | Status: AC
  Filled 2013-11-09: qty 2

## 2013-11-09 MED ORDER — ARTIFICIAL TEARS OP OINT
TOPICAL_OINTMENT | OPHTHALMIC | Status: DC | PRN
  Administered 2013-11-09: 1 via OPHTHALMIC

## 2013-11-09 MED ORDER — LIDOCAINE HCL (CARDIAC) 20 MG/ML IV SOLN
INTRAVENOUS | Status: AC
  Filled 2013-11-09: qty 5

## 2013-11-09 MED ORDER — PROPOFOL 10 MG/ML IV BOLUS
INTRAVENOUS | Status: DC | PRN
  Administered 2013-11-09: 200 mg via INTRAVENOUS

## 2013-11-09 MED ORDER — ALBUMIN HUMAN 5 % IV SOLN
INTRAVENOUS | Status: DC | PRN
  Administered 2013-11-09 (×2): via INTRAVENOUS

## 2013-11-09 MED ORDER — FENTANYL CITRATE 0.05 MG/ML IJ SOLN
100.0000 ug | Freq: Once | INTRAMUSCULAR | Status: AC
  Administered 2013-11-09: 100 ug via INTRAVENOUS

## 2013-11-09 MED ORDER — WHITE PETROLATUM GEL
Status: AC
  Administered 2013-11-09: 16:00:00
  Filled 2013-11-09: qty 5

## 2013-11-09 MED ORDER — ESMOLOL HCL 10 MG/ML IV SOLN
INTRAVENOUS | Status: DC | PRN
  Administered 2013-11-09: 10 mg via INTRAVENOUS

## 2013-11-09 MED ORDER — OXYCODONE HCL 5 MG/5ML PO SOLN: 5.0000 mg | Freq: Once | ORAL | Status: DC | PRN

## 2013-11-09 MED ORDER — GLYCOPYRROLATE 0.2 MG/ML IJ SOLN
INTRAMUSCULAR | Status: AC
  Filled 2013-11-09: qty 3

## 2013-11-09 MED ORDER — PROPOFOL 10 MG/ML IV BOLUS
INTRAVENOUS | Status: AC
Start: 2013-11-09 — End: 2013-11-09
  Filled 2013-11-09: qty 20

## 2013-11-09 MED ORDER — SODIUM CHLORIDE 0.9 % IV SOLN
500.0000 mL | Freq: Once | INTRAVENOUS | Status: AC
  Administered 2013-11-09: 500 mL via INTRAVENOUS

## 2013-11-09 MED ORDER — MIDAZOLAM HCL 5 MG/5ML IJ SOLN
INTRAMUSCULAR | Status: DC | PRN
  Administered 2013-11-09 (×2): 1 mg via INTRAVENOUS

## 2013-11-09 MED ORDER — OXYCODONE HCL 5 MG PO TABS: 5.0000 mg | ORAL_TABLET | Freq: Once | ORAL | Status: DC | PRN

## 2013-11-09 MED ORDER — PROPOFOL 10 MG/ML IV BOLUS
INTRAVENOUS | Status: AC
  Filled 2013-11-09: qty 20

## 2013-11-09 MED ORDER — FENTANYL CITRATE 0.05 MG/ML IJ SOLN
INTRAMUSCULAR | Status: AC
  Filled 2013-11-09: qty 5

## 2013-11-09 MED ORDER — ROCURONIUM BROMIDE 50 MG/5ML IV SOLN
INTRAVENOUS | Status: AC
  Filled 2013-11-09: qty 2

## 2013-11-09 MED ORDER — FENTANYL CITRATE 0.05 MG/ML IJ SOLN
INTRAMUSCULAR | Status: AC
  Administered 2013-11-09: 100 ug via INTRAVENOUS
  Filled 2013-11-09: qty 2

## 2013-11-09 MED ORDER — SUCCINYLCHOLINE CHLORIDE 20 MG/ML IJ SOLN
INTRAMUSCULAR | Status: AC
  Filled 2013-11-09: qty 1

## 2013-11-09 MED ORDER — NEOSTIGMINE METHYLSULFATE 10 MG/10ML IV SOLN
INTRAVENOUS | Status: DC | PRN
  Administered 2013-11-09: 4 mg via INTRAVENOUS

## 2013-11-09 MED ORDER — FENTANYL CITRATE 0.05 MG/ML IJ SOLN
INTRAMUSCULAR | Status: DC | PRN
  Administered 2013-11-09 (×6): 50 ug via INTRAVENOUS
  Administered 2013-11-09 (×2): 100 ug via INTRAVENOUS
  Administered 2013-11-09 (×2): 50 ug via INTRAVENOUS
  Administered 2013-11-09: 150 ug via INTRAVENOUS
  Administered 2013-11-09: 50 ug via INTRAVENOUS

## 2013-11-09 MED ORDER — ROCURONIUM BROMIDE 100 MG/10ML IV SOLN
INTRAVENOUS | Status: DC | PRN
  Administered 2013-11-09: 10 mg via INTRAVENOUS
  Administered 2013-11-09 (×2): 20 mg via INTRAVENOUS
  Administered 2013-11-09: 30 mg via INTRAVENOUS
  Administered 2013-11-09: 50 mg via INTRAVENOUS

## 2013-11-09 MED ORDER — CEFAZOLIN SODIUM-DEXTROSE 2-3 GM-% IV SOLR
INTRAVENOUS | Status: AC
  Administered 2013-11-09: 2 g via INTRAVENOUS
  Filled 2013-11-09: qty 50

## 2013-11-09 MED ORDER — PHENYLEPHRINE 40 MCG/ML (10ML) SYRINGE FOR IV PUSH (FOR BLOOD PRESSURE SUPPORT)
PREFILLED_SYRINGE | INTRAVENOUS | Status: AC
  Filled 2013-11-09: qty 10

## 2013-11-09 MED ORDER — HYDROMORPHONE HCL PF 1 MG/ML IJ SOLN
INTRAMUSCULAR | Status: AC
  Filled 2013-11-09: qty 1

## 2013-11-09 MED ORDER — LIDOCAINE HCL (CARDIAC) 20 MG/ML IV SOLN
INTRAVENOUS | Status: DC | PRN
  Administered 2013-11-09: 80 mg via INTRAVENOUS

## 2013-11-09 MED ORDER — NEOSTIGMINE METHYLSULFATE 10 MG/10ML IV SOLN
INTRAVENOUS | Status: AC
  Filled 2013-11-09: qty 1

## 2013-11-09 MED ORDER — ONDANSETRON HCL 4 MG/2ML IJ SOLN
INTRAMUSCULAR | Status: DC | PRN
  Administered 2013-11-09: 4 mg via INTRAVENOUS

## 2013-11-09 MED ORDER — STERILE WATER FOR INJECTION IJ SOLN
INTRAMUSCULAR | Status: AC
  Filled 2013-11-09: qty 10

## 2013-11-09 MED ORDER — MIDAZOLAM HCL 2 MG/2ML IJ SOLN
INTRAMUSCULAR | Status: AC
  Filled 2013-11-09: qty 2

## 2013-11-09 MED ORDER — GLYCOPYRROLATE 0.2 MG/ML IJ SOLN
INTRAMUSCULAR | Status: DC | PRN
  Administered 2013-11-09: 0.6 mg via INTRAVENOUS

## 2013-11-09 MED ORDER — PROMETHAZINE HCL 25 MG/ML IJ SOLN
6.2500 mg | INTRAMUSCULAR | Status: DC | PRN
Start: 2013-11-09 — End: 2013-11-09

## 2013-11-09 MED ORDER — CEFAZOLIN SODIUM-DEXTROSE 2-3 GM-% IV SOLR
2.0000 g | Freq: Once | INTRAVENOUS | Status: DC
  Administered 2013-11-22: 2 g via INTRAVENOUS

## 2013-11-09 MED ORDER — EPHEDRINE SULFATE 50 MG/ML IJ SOLN
INTRAMUSCULAR | Status: AC
  Filled 2013-11-09: qty 1

## 2013-11-09 MED ORDER — ARTIFICIAL TEARS OP OINT
TOPICAL_OINTMENT | OPHTHALMIC | Status: AC
  Filled 2013-11-09: qty 3.5

## 2013-11-09 MED ORDER — FENTANYL CITRATE 0.05 MG/ML IJ SOLN
100.0000 ug | Freq: Once | INTRAMUSCULAR | Status: DC
  Administered 2013-11-09 (×2): 50 ug via INTRAVENOUS

## 2013-11-09 MED ORDER — CEFAZOLIN SODIUM 1-5 GM-% IV SOLN
1.0000 g | Freq: Three times a day (TID) | INTRAVENOUS | Status: AC
  Administered 2013-11-09 – 2013-11-10 (×3): 1 g via INTRAVENOUS
  Filled 2013-11-09 (×3): qty 50

## 2013-11-09 MED ORDER — HYDROMORPHONE HCL PF 1 MG/ML IJ SOLN
0.2500 mg | INTRAMUSCULAR | Status: DC | PRN
  Administered 2013-11-09: 0.5 mg via INTRAVENOUS

## 2013-11-09 SURGICAL SUPPLY — 81 items
APPLIER CLIP 11 MED OPEN (CLIP)
BIT DRILL AO MATTA 2.5MX230M (BIT) ×2 IMPLANT
BIT DRILL TWST MATTA 4.5MX6.5M (BIT) ×2 IMPLANT
BLADE SURG ROTATE 9660 (MISCELLANEOUS) IMPLANT
BONE CHIP PRESERV 20CC (Bone Implant) IMPLANT
BRUSH SCRUB DISP (MISCELLANEOUS) ×8 IMPLANT
CLIP APPLIE 11 MED OPEN (CLIP) IMPLANT
CLOSURE WOUND 1/2 X4 (GAUZE/BANDAGES/DRESSINGS) ×1
COVER SURGICAL LIGHT HANDLE (MISCELLANEOUS) ×4 IMPLANT
DRAIN CHANNEL 10F 3/8 F FF (DRAIN) IMPLANT
DRAIN CHANNEL 15F RND FF W/TCR (WOUND CARE) IMPLANT
DRAPE C-ARM 42X72 X-RAY (DRAPES) IMPLANT
DRAPE C-ARMOR (DRAPES) ×4 IMPLANT
DRAPE INCISE IOBAN 66X45 STRL (DRAPES) ×4 IMPLANT
DRAPE INCISE IOBAN 85X60 (DRAPES) ×8 IMPLANT
DRAPE ORTHO SPLIT 77X108 STRL (DRAPES) ×4
DRAPE SURG ORHT 6 SPLT 77X108 (DRAPES) ×4 IMPLANT
DRAPE U-SHAPE 47X51 STRL (DRAPES) ×4 IMPLANT
DRILL BIT AO MATTA 2.5MX230M (BIT) ×4
DRILL TWIST MATTA 4.5MX6.5M (BIT) ×4
DRSG ADAPTIC 3X8 NADH LF (GAUZE/BANDAGES/DRESSINGS) IMPLANT
DRSG MEPILEX BORDER 4X12 (GAUZE/BANDAGES/DRESSINGS) ×4 IMPLANT
DRSG PAD ABDOMINAL 8X10 ST (GAUZE/BANDAGES/DRESSINGS) ×8 IMPLANT
ELECT BLADE 6.5 EXT (BLADE) IMPLANT
ELECT REM PT RETURN 9FT ADLT (ELECTROSURGICAL) ×4
ELECTRODE REM PT RTRN 9FT ADLT (ELECTROSURGICAL) ×2 IMPLANT
EVACUATOR 1/8 PVC DRAIN (DRAIN) IMPLANT
EVACUATOR SILICONE 100CC (DRAIN) ×4 IMPLANT
GAUZE SPONGE 4X4 12PLY STRL (GAUZE/BANDAGES/DRESSINGS) ×4 IMPLANT
GAUZE SPONGE 4X4 16PLY XRAY LF (GAUZE/BANDAGES/DRESSINGS) ×4 IMPLANT
GLOVE BIO SURGEON STRL SZ7.5 (GLOVE) ×4 IMPLANT
GLOVE BIO SURGEON STRL SZ8 (GLOVE) ×4 IMPLANT
GLOVE BIOGEL PI IND STRL 7.5 (GLOVE) ×2 IMPLANT
GLOVE BIOGEL PI IND STRL 8 (GLOVE) ×2 IMPLANT
GLOVE BIOGEL PI INDICATOR 7.5 (GLOVE) ×2
GLOVE BIOGEL PI INDICATOR 8 (GLOVE) ×2
GOWN STRL REUS W/ TWL LRG LVL3 (GOWN DISPOSABLE) ×4 IMPLANT
GOWN STRL REUS W/ TWL XL LVL3 (GOWN DISPOSABLE) ×2 IMPLANT
GOWN STRL REUS W/TWL 2XL LVL3 (GOWN DISPOSABLE) ×4 IMPLANT
GOWN STRL REUS W/TWL LRG LVL3 (GOWN DISPOSABLE) ×4
GOWN STRL REUS W/TWL XL LVL3 (GOWN DISPOSABLE) ×2
HANDPIECE INTERPULSE COAX TIP (DISPOSABLE)
KIT BASIN OR (CUSTOM PROCEDURE TRAY) ×4 IMPLANT
KIT ROOM TURNOVER OR (KITS) ×4 IMPLANT
LIGHT ORTHO (MISCELLANEOUS) IMPLANT
LOOP VESSEL MAXI BLUE (MISCELLANEOUS) IMPLANT
MANIFOLD NEPTUNE II (INSTRUMENTS) ×4 IMPLANT
NEEDLE MAYO TROCAR (NEEDLE) ×4 IMPLANT
NS IRRIG 1000ML POUR BTL (IV SOLUTION) ×4 IMPLANT
PACK TOTAL JOINT (CUSTOM PROCEDURE TRAY) ×4 IMPLANT
PAD ARMBOARD 7.5X6 YLW CONV (MISCELLANEOUS) ×8 IMPLANT
PIN APEX 6X180MM EXFIX (EXFIX) ×4 IMPLANT
PLATE ACET STRT 94.5M 8H (Plate) ×8 IMPLANT
PLATE FLEX MPS 9H ANNEALED (Plate) ×4 IMPLANT
RETRIEVER SUT HEWSON (MISCELLANEOUS) ×4 IMPLANT
SCREW 3.5X46MM (Screw) ×8 IMPLANT
SCREW CORTEX ST MATTA 3.5X24 (Screw) ×8 IMPLANT
SCREW CORTEX ST MATTA 3.5X28MM (Screw) ×4 IMPLANT
SCREW CORTEX ST MATTA 3.5X32MM (Screw) ×4 IMPLANT
SCREW CORTEX ST MATTA 3.5X34MM (Screw) ×8 IMPLANT
SCREW CORTEX ST MATTA 3.5X38M (Screw) ×8 IMPLANT
SCREW CORTEX ST MATTA 3.5X40MM (Screw) ×4 IMPLANT
SCREW CORTEX ST MATTA 3.5X55MM (Screw) ×12 IMPLANT
SCREW CORTICAL 3.5X42MM (Screw) ×12 IMPLANT
SET HNDPC FAN SPRY TIP SCT (DISPOSABLE) IMPLANT
SPONGE LAP 18X18 X RAY DECT (DISPOSABLE) ×12 IMPLANT
STAPLER VISISTAT 35W (STAPLE) ×4 IMPLANT
STRIP CLOSURE SKIN 1/2X4 (GAUZE/BANDAGES/DRESSINGS) ×3 IMPLANT
SUCTION FRAZIER TIP 10 FR DISP (SUCTIONS) ×4 IMPLANT
SUT FIBERWIRE #2 38 T-5 BLUE (SUTURE) ×8
SUT VIC AB 0 CT1 27 (SUTURE) ×2
SUT VIC AB 0 CT1 27XBRD ANBCTR (SUTURE) ×2 IMPLANT
SUT VIC AB 1 CT1 18XCR BRD 8 (SUTURE) ×2 IMPLANT
SUT VIC AB 1 CT1 8-18 (SUTURE) ×2
SUT VIC AB 2-0 CT1 27 (SUTURE) ×2
SUT VIC AB 2-0 CT1 TAPERPNT 27 (SUTURE) ×2 IMPLANT
SUTURE FIBERWR #2 38 T-5 BLUE (SUTURE) ×4 IMPLANT
TOWEL OR 17X24 6PK STRL BLUE (TOWEL DISPOSABLE) ×4 IMPLANT
TOWEL OR 17X26 10 PK STRL BLUE (TOWEL DISPOSABLE) ×8 IMPLANT
TRAY FOLEY CATH 16FRSI W/METER (SET/KITS/TRAYS/PACK) IMPLANT
WATER STERILE IRR 1000ML POUR (IV SOLUTION) IMPLANT

## 2013-11-09 SURGICAL SUPPLY — 63 items
APPLIER CLIP 11 MED OPEN (CLIP)
BLADE SURG ROTATE 9660 (MISCELLANEOUS) IMPLANT
BRUSH SCRUB DISP (MISCELLANEOUS) ×3 IMPLANT
CLIP APPLIE 11 MED OPEN (CLIP) IMPLANT
CLOSURE WOUND 1/2 X4 (GAUZE/BANDAGES/DRESSINGS)
COVER SURGICAL LIGHT HANDLE (MISCELLANEOUS) ×3 IMPLANT
DRAIN CHANNEL 10F 3/8 F FF (DRAIN) IMPLANT
DRAIN CHANNEL 15F RND FF W/TCR (WOUND CARE) IMPLANT
DRAPE C-ARM 42X72 X-RAY (DRAPES) IMPLANT
DRAPE C-ARMOR (DRAPES) IMPLANT
DRAPE INCISE IOBAN 66X45 STRL (DRAPES) IMPLANT
DRAPE INCISE IOBAN 85X60 (DRAPES) ×3 IMPLANT
DRAPE ORTHO SPLIT 77X108 STRL (DRAPES) ×2
DRAPE SURG ORHT 6 SPLT 77X108 (DRAPES) ×1 IMPLANT
DRAPE U-SHAPE 47X51 STRL (DRAPES) ×3 IMPLANT
DRSG ADAPTIC 3X8 NADH LF (GAUZE/BANDAGES/DRESSINGS) ×3 IMPLANT
DRSG PAD ABDOMINAL 8X10 ST (GAUZE/BANDAGES/DRESSINGS) IMPLANT
ELECT BLADE 6.5 EXT (BLADE) IMPLANT
ELECT REM PT RETURN 9FT ADLT (ELECTROSURGICAL) ×3
ELECTRODE REM PT RTRN 9FT ADLT (ELECTROSURGICAL) ×1 IMPLANT
EVACUATOR 1/8 PVC DRAIN (DRAIN) IMPLANT
EVACUATOR SILICONE 100CC (DRAIN) IMPLANT
GAUZE SPONGE 4X4 12PLY STRL (GAUZE/BANDAGES/DRESSINGS) IMPLANT
GAUZE SPONGE 4X4 16PLY XRAY LF (GAUZE/BANDAGES/DRESSINGS) IMPLANT
GLOVE BIO SURGEON STRL SZ7.5 (GLOVE) ×3 IMPLANT
GLOVE BIO SURGEON STRL SZ8 (GLOVE) ×3 IMPLANT
GLOVE BIOGEL PI IND STRL 7.5 (GLOVE) ×1 IMPLANT
GLOVE BIOGEL PI IND STRL 8 (GLOVE) ×1 IMPLANT
GLOVE BIOGEL PI INDICATOR 7.5 (GLOVE) ×2
GLOVE BIOGEL PI INDICATOR 8 (GLOVE) ×2
GOWN STRL REUS W/ TWL LRG LVL3 (GOWN DISPOSABLE) ×2 IMPLANT
GOWN STRL REUS W/ TWL XL LVL3 (GOWN DISPOSABLE) ×1 IMPLANT
GOWN STRL REUS W/TWL 2XL LVL3 (GOWN DISPOSABLE) IMPLANT
GOWN STRL REUS W/TWL LRG LVL3 (GOWN DISPOSABLE) ×4
GOWN STRL REUS W/TWL XL LVL3 (GOWN DISPOSABLE) ×2
HANDPIECE INTERPULSE COAX TIP (DISPOSABLE)
KIT BASIN OR (CUSTOM PROCEDURE TRAY) ×3 IMPLANT
KIT ROOM TURNOVER OR (KITS) ×3 IMPLANT
LIGHT ORTHO (MISCELLANEOUS) IMPLANT
LOOP VESSEL MAXI BLUE (MISCELLANEOUS) IMPLANT
MANIFOLD NEPTUNE II (INSTRUMENTS) IMPLANT
NEEDLE MAYO TROCAR (NEEDLE) ×3 IMPLANT
NS IRRIG 1000ML POUR BTL (IV SOLUTION) ×3 IMPLANT
PACK TOTAL JOINT (CUSTOM PROCEDURE TRAY) ×3 IMPLANT
PAD ARMBOARD 7.5X6 YLW CONV (MISCELLANEOUS) ×6 IMPLANT
RETRIEVER SUT HEWSON (MISCELLANEOUS) IMPLANT
SET HNDPC FAN SPRY TIP SCT (DISPOSABLE) IMPLANT
SPONGE LAP 18X18 X RAY DECT (DISPOSABLE) IMPLANT
STAPLER VISISTAT 35W (STAPLE) ×3 IMPLANT
STRIP CLOSURE SKIN 1/2X4 (GAUZE/BANDAGES/DRESSINGS) IMPLANT
SUCTION FRAZIER TIP 10 FR DISP (SUCTIONS) ×3 IMPLANT
SUT FIBERWIRE #2 38 T-5 BLUE (SUTURE) ×6
SUT VIC AB 0 CT1 27 (SUTURE) ×4
SUT VIC AB 0 CT1 27XBRD ANBCTR (SUTURE) ×2 IMPLANT
SUT VIC AB 1 CT1 18XCR BRD 8 (SUTURE) ×1 IMPLANT
SUT VIC AB 1 CT1 8-18 (SUTURE) ×2
SUT VIC AB 2-0 CT1 27 (SUTURE) ×4
SUT VIC AB 2-0 CT1 TAPERPNT 27 (SUTURE) ×2 IMPLANT
SUTURE FIBERWR #2 38 T-5 BLUE (SUTURE) ×2 IMPLANT
TOWEL OR 17X24 6PK STRL BLUE (TOWEL DISPOSABLE) ×3 IMPLANT
TOWEL OR 17X26 10 PK STRL BLUE (TOWEL DISPOSABLE) ×6 IMPLANT
TRAY FOLEY CATH 16FRSI W/METER (SET/KITS/TRAYS/PACK) IMPLANT
WATER STERILE IRR 1000ML POUR (IV SOLUTION) IMPLANT

## 2013-11-09 NOTE — Anesthesia Preprocedure Evaluation (Addendum)
Anesthesia Evaluation  Patient identified by MRN, date of birth, ID band Patient awake    Reviewed: Allergy & Precautions, H&P , NPO status , Patient's Chart, lab work & pertinent test results  Airway Mallampati: II TM Distance: >3 FB Neck ROM: Full    Dental no notable dental hx.    Pulmonary former smoker,  breath sounds clear to auscultation  Pulmonary exam normal       Cardiovascular negative cardio ROS  Rhythm:Regular Rate:Normal     Neuro/Psych CT head and neck negative for intracranial or cervical injury negative neurological ROS  negative psych ROS   GI/Hepatic Neg liver ROS, GERD-  Medicated,  Endo/Other  diabetes, Type 2Possibly stress related. Glc 220s  Renal/GU negative Renal ROS     Musculoskeletal negative musculoskeletal ROS (+)   Abdominal   Peds  Hematology negative hematology ROS (+)   Anesthesia Other Findings   Reproductive/Obstetrics                          Anesthesia Physical  Anesthesia Plan  ASA: III  Anesthesia Plan: General   Post-op Pain Management:    Induction: Intravenous  Airway Management Planned: Oral ETT  Additional Equipment:   Intra-op Plan:   Post-operative Plan: Extubation in OR  Informed Consent: I have reviewed the patients History and Physical, chart, labs and discussed the procedure including the risks, benefits and alternatives for the proposed anesthesia with the patient or authorized representative who has indicated his/her understanding and acceptance.   Dental advisory given  Plan Discussed with: CRNA  Anesthesia Plan Comments:        Anesthesia Quick Evaluation
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
## 2013-11-09 NOTE — Transfer of Care (Signed)
Immediate Anesthesia Transfer of Care Note  Patient: Jerry BushKenneth Holland  Procedure(s) Performed: Procedure(s): OPEN REDUCTION INTERNAL FIXATION (ORIF) ACETABULAR FRACTURE (Left)  Patient Location: PACU and Short Stay  Anesthesia Type:case cancelled due to K+  Level of Consciousness: awake, alert , oriented and patient cooperative  Airway & Oxygen Therapy: Patient Spontanous Breathing and Patient connected to nasal cannula oxygen  Post-op Assessment: Report given to PACU RN, Post -op Vital signs reviewed and stable and Patient moving all extremities  Post vital signs: Reviewed and stable  Complications: No apparent anesthesia complications

## 2013-11-09 NOTE — Transfer of Care (Signed)
Immediate Anesthesia Transfer of Care Note  Patient: Jerry Holland  Procedure(s) Performed: Procedure(s) with comments: OPEN REDUCTION INTERNAL FIXATION (ORIF) ACETABULAR FRACTURE (N/A) CAST APPLICATION (Right) - Application of cast to right ankle fracture  Patient Location: PACU  Anesthesia Type:General  Level of Consciousness: awake, alert  and oriented  Airway & Oxygen Therapy: Patient Spontanous Breathing and Patient connected to nasal cannula oxygen  Post-op Assessment: Report given to PACU RN and Post -op Vital signs reviewed and stable  Post vital signs: Reviewed and stable  Complications: No apparent anesthesia complications

## 2013-11-09 NOTE — Anesthesia Preprocedure Evaluation (Addendum)
Anesthesia Evaluation  Patient identified by MRN, date of birth, ID band Patient awake    Reviewed: Allergy & Precautions, H&P , NPO status , Patient's Chart, lab work & pertinent test results  Airway Mallampati: II TM Distance: >3 FB Neck ROM: Full    Dental no notable dental hx.    Pulmonary former smoker,  breath sounds clear to auscultation  Pulmonary exam normal       Cardiovascular negative cardio ROS  Rhythm:Regular Rate:Normal     Neuro/Psych CT head and neck negative for intracranial or cervical injury negative neurological ROS  negative psych ROS   GI/Hepatic Neg liver ROS, GERD-  Medicated,  Endo/Other  diabetes, Type 2Possibly stress related. Glc 220s  Renal/GU negative Renal ROS     Musculoskeletal negative musculoskeletal ROS (+)   Abdominal   Peds  Hematology negative hematology ROS (+)   Anesthesia Other Findings   Reproductive/Obstetrics                        Anesthesia Physical Anesthesia Plan  ASA: II  Anesthesia Plan: General   Post-op Pain Management:    Induction: Intravenous  Airway Management Planned: Oral ETT  Additional Equipment:   Intra-op Plan:   Post-operative Plan: Extubation in OR  Informed Consent: I have reviewed the patients History and Physical, chart, labs and discussed the procedure including the risks, benefits and alternatives for the proposed anesthesia with the patient or authorized representative who has indicated his/her understanding and acceptance.   Dental advisory given  Plan Discussed with: Anesthesiologist and Surgeon  Anesthesia Plan Comments:         Anesthesia Quick Evaluation

## 2013-11-09 NOTE — Progress Notes (Signed)
Fentanyl PCA syringe removed from the pump and handed to Casey/RN.

## 2013-11-09 NOTE — Anesthesia Procedure Notes (Signed)
Procedure Name: Intubation Date/Time: 11/09/2013 9:14 AM Performed by: Leonel Ramsay'LAUGHLIN, Pierre Cumpton H Pre-anesthesia Checklist: Patient identified, Timeout performed, Emergency Drugs available, Suction available and Patient being monitored Patient Re-evaluated:Patient Re-evaluated prior to inductionOxygen Delivery Method: Circle system utilized Preoxygenation: Pre-oxygenation with 100% oxygen Intubation Type: IV induction Ventilation: Mask ventilation without difficulty Laryngoscope Size: Mac and 3 Grade View: Grade I Tube type: Oral Tube size: 7.5 mm Number of attempts: 1 Airway Equipment and Method: Stylet Placement Confirmation: ETT inserted through vocal cords under direct vision,  positive ETCO2 and breath sounds checked- equal and bilateral Secured at: 23 cm Tube secured with: Tape Dental Injury: Teeth and Oropharynx as per pre-operative assessment
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
## 2013-11-09 NOTE — Progress Notes (Signed)
Pt c/o pain in left leg and hip.Reported to Dr. Sharee Holstererry Massagee and order obtained for fentanyl 14700mcg/2cc to be given !V.  Administered to patient and he states pain better.
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
## 2013-11-10 ENCOUNTER — Inpatient Hospital Stay (HOSPITAL_COMMUNITY): Payer: BC Managed Care – PPO

## 2013-11-10 LAB — TYPE AND SCREEN
ABO/RH(D): B POS
ANTIBODY SCREEN: NEGATIVE
UNIT DIVISION: 0
UNIT DIVISION: 0
Unit division: 0

## 2013-11-10 LAB — POCT I-STAT 4, (NA,K, GLUC, HGB,HCT)
GLUCOSE: 108 mg/dL — AB (ref 70–99)
Glucose, Bld: 106 mg/dL — ABNORMAL HIGH (ref 70–99)
HCT: 36 % — ABNORMAL LOW (ref 39.0–52.0)
HEMATOCRIT: 24 % — AB (ref 39.0–52.0)
HEMOGLOBIN: 8.2 g/dL — AB (ref 13.0–17.0)
Hemoglobin: 12.2 g/dL — ABNORMAL LOW (ref 13.0–17.0)
POTASSIUM: 4.6 meq/L (ref 3.7–5.3)
Potassium: 4.4 meq/L (ref 3.7–5.3)
Sodium: 135 meq/L — ABNORMAL LOW (ref 137–147)
Sodium: 135 mEq/L — ABNORMAL LOW (ref 137–147)

## 2013-11-10 LAB — BASIC METABOLIC PANEL
ANION GAP: 11 (ref 5–15)
BUN: 9 mg/dL (ref 6–23)
CALCIUM: 7.6 mg/dL — AB (ref 8.4–10.5)
CO2: 24 mEq/L (ref 19–32)
Chloride: 100 mEq/L (ref 96–112)
Creatinine, Ser: 1.01 mg/dL (ref 0.50–1.35)
GFR calc Af Amer: >90 mL/min (ref >90)
GFR calc non Af Amer: 79 mL/min — ABNORMAL LOW (ref >90)
Glucose, Bld: 110 mg/dL — ABNORMAL HIGH (ref 70–99)
Potassium: 4 mEq/L (ref 3.7–5.3)
Sodium: 135 mEq/L — ABNORMAL LOW (ref 137–147)

## 2013-11-10 LAB — CBC WITH DIFFERENTIAL/PLATELET
BASOS ABS: 0 10*3/uL (ref 0.0–0.1)
BASOS PCT: 0 % (ref 0–1)
EOS PCT: 1 % (ref 0–5)
Eosinophils Absolute: 0 10*3/uL (ref 0.0–0.7)
HCT: 24.3 % — ABNORMAL LOW (ref 39.0–52.0)
Hemoglobin: 8.8 g/dL — ABNORMAL LOW (ref 13.0–17.0)
Lymphocytes Relative: 8 % — ABNORMAL LOW (ref 12–46)
Lymphs Abs: 0.5 10*3/uL — ABNORMAL LOW (ref 0.7–4.0)
MCH: 31 pg (ref 26.0–34.0)
MCHC: 36.2 g/dL — ABNORMAL HIGH (ref 30.0–36.0)
MCV: 85.6 fL (ref 78.0–100.0)
MONOS PCT: 10 % (ref 3–12)
Monocytes Absolute: 0.6 10*3/uL (ref 0.1–1.0)
NEUTROS ABS: 4.7 10*3/uL (ref 1.7–7.7)
Neutrophils Relative %: 81 % — ABNORMAL HIGH (ref 43–77)
Platelets: 135 10*3/uL — ABNORMAL LOW (ref 150–400)
RBC: 2.84 MIL/uL — ABNORMAL LOW (ref 4.22–5.81)
RDW: 13.2 % (ref 11.5–15.5)
WBC: 5.8 10*3/uL (ref 4.0–10.5)

## 2013-11-10 MED ORDER — ACETAMINOPHEN 500 MG PO TABS
1000.0000 mg | ORAL_TABLET | Freq: Four times a day (QID) | ORAL | Status: DC
  Administered 2013-11-10 – 2013-11-12 (×7): 1000 mg via ORAL
  Filled 2013-11-10 (×9): qty 2

## 2013-11-10 MED ORDER — METHOCARBAMOL 500 MG PO TABS
500.0000 mg | ORAL_TABLET | Freq: Four times a day (QID) | ORAL | Status: DC | PRN
  Administered 2013-11-10 – 2013-11-11 (×2): 500 mg via ORAL
  Administered 2013-11-11: 1000 mg via ORAL
  Administered 2013-11-12 – 2013-11-15 (×3): 500 mg via ORAL
  Filled 2013-11-10: qty 2
  Filled 2013-11-10: qty 1
  Filled 2013-11-10: qty 2
  Filled 2013-11-10: qty 1
  Filled 2013-11-10: qty 2
  Filled 2013-11-10: qty 1
  Filled 2013-11-10: qty 2

## 2013-11-10 NOTE — Progress Notes (Signed)
LFA dressing - Old dressing removed. Skin flap tear approx 1mm depth, 5cm widex 3 cm length. Wound has slight bleeding from site after initial dressing removed. Cleansed with sterile normal saline, Bacterostatic onit placed over wound bed, Betadine cleansed surrounding intact skin prior to dressing. Dressing done with sterile technique, vaseline saturated guaze covered open wound bed, followed by telfa dressings x 2, then covered with 3 4x4 gauze to ensure placement, after which lfa was wrapped with guaze and secured with tape. Patient tolerated procedure very well. Family members at bedside also donned masks to ensure environment from airborne microbes.

## 2013-11-10 NOTE — Progress Notes (Addendum)
250 mcg fentanyl wasted in sink by Monica BectonMarge Lessard, RN and witnessed by Dorothea OgleGregg Marcile Fuquay, RN, MSN from patient's discontinued PCA pump.  I witnessed the fentanyl wasted above.  Marcos EkeGreg Breckin Savannah, RN, MSN
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
## 2013-11-11 LAB — CBC WITH DIFFERENTIAL/PLATELET
BASOS PCT: 1 % (ref 0–1)
Basophils Absolute: 0 10*3/uL (ref 0.0–0.1)
EOS ABS: 0.2 10*3/uL (ref 0.0–0.7)
Eosinophils Relative: 3 % (ref 0–5)
HCT: 22.4 % — ABNORMAL LOW (ref 39.0–52.0)
HEMOGLOBIN: 8 g/dL — AB (ref 13.0–17.0)
Lymphocytes Relative: 9 % — ABNORMAL LOW (ref 12–46)
Lymphs Abs: 0.5 10*3/uL — ABNORMAL LOW (ref 0.7–4.0)
MCH: 30.2 pg (ref 26.0–34.0)
MCHC: 35.7 g/dL (ref 30.0–36.0)
MCV: 84.5 fL (ref 78.0–100.0)
MONOS PCT: 9 % (ref 3–12)
Monocytes Absolute: 0.5 10*3/uL (ref 0.1–1.0)
NEUTROS ABS: 4 10*3/uL (ref 1.7–7.7)
NEUTROS PCT: 78 % — AB (ref 43–77)
PLATELETS: 138 10*3/uL — AB (ref 150–400)
RBC: 2.65 MIL/uL — ABNORMAL LOW (ref 4.22–5.81)
RDW: 13.4 % (ref 11.5–15.5)
WBC: 5.1 10*3/uL (ref 4.0–10.5)

## 2013-11-11 LAB — PREPARE PLATELET PHERESIS: UNIT DIVISION: 0

## 2013-11-11 LAB — BASIC METABOLIC PANEL
Anion gap: 10 (ref 5–15)
BUN: 9 mg/dL (ref 6–23)
CO2: 26 mEq/L (ref 19–32)
Calcium: 8.1 mg/dL — ABNORMAL LOW (ref 8.4–10.5)
Chloride: 100 mEq/L (ref 96–112)
Creatinine, Ser: 0.92 mg/dL (ref 0.50–1.35)
GFR calc Af Amer: >90 mL/min (ref >90)
Glucose, Bld: 123 mg/dL — ABNORMAL HIGH (ref 70–99)
Potassium: 4.2 mEq/L (ref 3.7–5.3)
SODIUM: 136 meq/L — AB (ref 137–147)
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
## 2013-11-11 NOTE — Progress Notes (Signed)
Clinical Social Work Department CLINICAL SOCIAL WORK PLACEMENT NOTE 11/11/2013  Patient:  Jerry Holland, Jerry Holland  Account Number:  [id number] Admit date:  11/07/2013  Clinical Social Worker:  Jetta Lout, [place]  Date/time:  11/11/2013 06:00 PM  Clinical Social Work is seeking post-discharge placement for this patient at the following level of care:   SKILLED NURSING   (*CSW will update this form in Epic as items are completed)   11/11/2013  Patient/family provided with [facility] Department of Clinical Social Work's list of facilities offering this level of care within the geographic area requested by the patient (or if unable, by the patient's family).  11/11/2013  Patient/family informed of their freedom to choose among providers that offer the needed level of care, that participate in Medicare, Medicaid or managed care program needed by the patient, have an available bed and are willing to accept the patient.  11/11/2013  Patient/family informed of [facility]' ownership interest in [facility], as well as of the fact that they are under no obligation to receive care at this facility.  PASARR submitted to EDS on  PASARR number received on   FL2 transmitted to all facilities in geographic area requested by pt/family on  11/11/2013 FL2 transmitted to all facilities within larger geographic area on   Patient informed that his/her managed care company has contracts with or will negotiate with  certain facilities, including the following:     Patient/family informed of bed offers received:   Patient chooses bed at  Physician recommends and patient chooses bed at    Patient to be transferred to  on   Patient to be transferred to facility by  Patient and family notified of transfer on  Name of family member notified:    The following physician request were entered in Epic:   Additional Comments: PASARR is not needed for SNF in [place].
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
## 2013-11-12 ENCOUNTER — Ambulatory Visit
Admit: 2013-11-12 | Discharge: 2013-11-12 | Disposition: A | Discharge status: Home / Self Care | Payer: No Typology Code available for payment source | Attending: Radiation Oncology | Admitting: Radiation Oncology

## 2013-11-12 ENCOUNTER — Encounter: Payer: Self-pay | Admitting: Radiation Oncology

## 2013-11-12 DIAGNOSIS — S92001A Unspecified fracture of right calcaneus, initial encounter for closed fracture: Secondary | ICD-10-CM | POA: Diagnosis present

## 2013-11-12 DIAGNOSIS — D62 Acute posthemorrhagic anemia: Secondary | ICD-10-CM | POA: Diagnosis not present

## 2013-11-12 DIAGNOSIS — S42401A Unspecified fracture of lower end of right humerus, initial encounter for closed fracture: Secondary | ICD-10-CM | POA: Diagnosis present

## 2013-11-12 LAB — CBC
HCT: 24.3 % — ABNORMAL LOW (ref 39.0–52.0)
Hemoglobin: 8.8 g/dL — ABNORMAL LOW (ref 13.0–17.0)
MCH: 30.8 pg (ref 26.0–34.0)
MCHC: 36.2 g/dL — ABNORMAL HIGH (ref 30.0–36.0)
MCV: 85 fL (ref 78.0–100.0)
PLATELETS: 196 10*3/uL (ref 150–400)
RBC: 2.86 MIL/uL — ABNORMAL LOW (ref 4.22–5.81)
RDW: 13 % (ref 11.5–15.5)
WBC: 5.5 10*3/uL (ref 4.0–10.5)

## 2013-11-12 MED ORDER — OXYCODONE-ACETAMINOPHEN 5-325 MG PO TABS: 1.0000 | ORAL_TABLET | Freq: Four times a day (QID) | ORAL | Status: DC | PRN

## 2013-11-12 NOTE — Progress Notes (Signed)
[facility] Radiation Oncology End of Treatment Note  Name:Jerry Holland  Date: 11/12/2013 ZOX:[id number] DOB:[birth year]   Status:inpatient    CC:   Dr. Myrene Galas  REFERRING PHYSICIAN:    Dr. Myrene Galas   DIAGNOSIS:  Left acetabular fracture, prevention of heterotopic bone formation  INDICATION FOR TREATMENT: Prophylaxis for heterotopic bone formation   TREATMENT DATE: 11/12/2013                          SITE/DOSE:   Left hip/acetabulum 700 cGy in one fraction                         BEAMS/ENERGY:  15 MV photons parallel opposed anterior and  posterior field                NARRATIVE:  The patient tolerated his treatment well and was sent back to [facility] in stable condition .                          PLAN: Followup through Dr. Carola Frost.  Patient instructed to call if questions or worsening complaints in interim.
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
## 2013-11-12 NOTE — Progress Notes (Signed)
I saw and examined the patient with Mr. Paul, communicating the findings and plan noted above.  Orine Goga, MD [facility] [phone] (p)  
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
## 2013-11-12 NOTE — Clinical Social Work Note (Addendum)
1:20pm- CSW spoke to [employer] coordinator at [place] and provided clinical update. SNF is currently requesting insurance authorization.   11:41am- CSW left message for Delanie at [facility].  [phone] CSW awaiting a return call.  Vickii Penna, LCSWA [phone]  Clinical Social Work

## 2013-11-12 NOTE — Progress Notes (Signed)
[facility] Radiation Oncology Dept Therapy Treatment Record Phone [phone]   Radiation Therapy was administered to Jerry Holland on: 11/12/2013  12:22 PM and was treatment # 1 out of a planned course of 1 treatments.

## 2013-11-12 NOTE — Progress Notes (Signed)
Orthopedic Tech Progress Note Patient Details:  Jerry Holland February 23, 1956 [id number] OHF applied to bed Patient ID: Jerry Holland, male   DOB: Jan 21, 1956, 58 y.o.   MRN: [id number]   [name] 11/12/2013, 2:48 PM
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
## 2013-11-12 NOTE — Progress Notes (Signed)
OT Cancellation Note  Patient Details Name: Jerry Holland MRN: [id number] DOB: May 12, 1955   Cancelled Treatment:    Reason Eval/Treat Not Completed: Patient at procedure or test/ unavailable. Pt on his way to radiology at [facility] for XRT treatment. Will re-attempt eval tomorrow.  Evette Georges [phone] 11/12/2013, 1:37 PM

## 2013-11-12 NOTE — Progress Notes (Signed)
Patient arrived via [facility] for planning and treatment of heterotopic right hip. Daughter with patient. Pt's VS stable. He denies pain, only reports "I am very tired." Pt resting quietly on stretcher exam rm 1.

## 2013-11-12 NOTE — Progress Notes (Signed)
This patient has been seen and I agree with the findings and treatment plan.  Joyelle Siedlecki O. Bud Kaeser, III, MD, FACS [phone] (pager) [phone] (direct pager) Trauma Surgeon  

## 2013-11-12 NOTE — Progress Notes (Signed)
SLP Cancellation Note  Patient Details Name: Serapio Edelson MRN: [id number] DOB: October 23, 1955   Cancelled treatment:       Reason Eval/Treat Not Completed: Patient at procedure or test/unavailable. Will return as able for bedside swallow evaluation.    Maxcine Ham, M.A. CCC-SLP [phone]  Maxcine Ham 11/12/2013, 1:03 PM

## 2013-11-12 NOTE — Progress Notes (Signed)
Simulation/treatment planning note: The patient was taken to the [facility]. Initial port films were taken and the isocenter was moved along with  collimator rotation and change in the width and length. The dose calculation was checked and the patient was treated.
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
## 2013-11-13 ENCOUNTER — Inpatient Hospital Stay (HOSPITAL_COMMUNITY): Payer: BC Managed Care – PPO

## 2013-11-13 ENCOUNTER — Encounter (HOSPITAL_COMMUNITY): Payer: Self-pay | Admitting: Orthopedic Surgery

## 2013-11-13 DIAGNOSIS — R1310 Dysphagia, unspecified: Secondary | ICD-10-CM

## 2013-11-13 MED ORDER — MAGNESIUM CITRATE PO SOLN: 1.0000 | Freq: Once | ORAL | Status: DC

## 2013-11-13 MED ORDER — PANTOPRAZOLE SODIUM 40 MG PO PACK
40.0000 mg | PACK | Freq: Every day | ORAL | Status: DC
  Administered 2013-11-15: 40 mg
  Filled 2013-11-13 (×3): qty 20

## 2013-11-13 MED ORDER — ALPRAZOLAM 0.5 MG PO TABS
1.0000 mg | ORAL_TABLET | Freq: Three times a day (TID) | ORAL | Status: DC | PRN
  Administered 2013-11-13 – 2013-11-15 (×3): 1 mg via ORAL
  Filled 2013-11-13 (×3): qty 2

## 2013-11-13 NOTE — Progress Notes (Signed)
Patient is getting better.  Awaiting placement.  This patient has been seen and I agree with the findings and treatment plan.  Marta Lamas. Gae Bon, MD, FACS [phone] (pager) [phone] (direct pager) Trauma Surgeon
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
## 2013-11-13 NOTE — Progress Notes (Signed)
UR completed. CSW managing the SNF placement.   Carlyle Lipa, RN BSN MHA CCM Trauma/Neuro ICU Case Manager [phone]
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
## 2013-11-14 MED ORDER — ENOXAPARIN SODIUM 40 MG/0.4ML ~~LOC~~ SOLN
40.0000 mg | SUBCUTANEOUS | Status: DC
Start: 2013-11-14 — End: 2013-11-24

## 2013-11-14 MED ORDER — PANTOPRAZOLE SODIUM 40 MG PO PACK: 40.0000 mg | PACK | Freq: Every day | ORAL | Status: DC

## 2013-11-14 MED ORDER — OXYCODONE HCL 5 MG PO TABS: 5.0000 mg | ORAL_TABLET | ORAL | Status: DC | PRN

## 2013-11-14 MED ORDER — METHOCARBAMOL 500 MG PO TABS: 500.0000 mg | ORAL_TABLET | Freq: Four times a day (QID) | ORAL | Status: DC | PRN

## 2013-11-14 MED ORDER — DSS 100 MG PO CAPS: 100.0000 mg | ORAL_CAPSULE | Freq: Two times a day (BID) | ORAL | Status: AC

## 2013-11-14 MED ORDER — POLYETHYLENE GLYCOL 3350 17 G PO PACK: 17.0000 g | PACK | Freq: Every day | ORAL | Status: AC
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
## 2013-11-15 NOTE — Clinical Social Work Note (Signed)
Pt discharged today to [facility] in [place].  Pt awaiting possible surgery - date: Thursday, September 3rd per report.  Admitting SNF requested peer-to-peer with PT prior to admission.  RN Director at [employer] [employer] is Rexene Edison and was reached by PT at [phone].  CSW received confirmation of acceptance from [facility].  [facility] can be reached at [phone].  Transportation was provided by [employer] and Support out of [place] [phone], per family request.  Vickii Penna, LCSWA [phone]  Clinical Social Work

## 2013-11-15 NOTE — Progress Notes (Signed)
Patients daughter requests that RN not do hourly rounds on her father because he is trying to get rest. Daughter stated that she will page RN if needed.

## 2013-11-19 NOTE — Progress Notes (Signed)
Pt's daughter called to ask what time pt needs to be here on Thursday. Pt is scheduled for 0730, so I instructed her to have him arrive at 0530.  She said he will be coming on a stretcher and she is trying to arrange transportation. They are 1 1/2 hours away,so she will be bring her mother at the same time. She stated that they are both supposed to have surgery on Thursday, and I informed her that at this time, only her father is scheduled.  She will call Dr. Magdalene Patricia office to check into this.

## 2013-11-20 ENCOUNTER — Other Ambulatory Visit: Payer: Self-pay | Admitting: Orthopedic Surgery

## 2013-11-20 ENCOUNTER — Encounter (HOSPITAL_COMMUNITY): Payer: Self-pay

## 2013-11-20 NOTE — Anesthesia Postprocedure Evaluation (Signed)
  Anesthesia Post-op Note  Patient: Jerry Holland  Procedure(s) Performed: Procedure(s) with comments: OPEN REDUCTION INTERNAL FIXATION (ORIF) ACETABULAR FRACTURE (N/A) CAST APPLICATION (Right) - Application of cast to right ankle fracture  Patient Location: PACU  Anesthesia Type:General  Level of Consciousness: awake and alert   Airway and Oxygen Therapy: Patient Spontanous Breathing  Post-op Pain: mild  Post-op Assessment: Post-op Vital signs reviewed  Post-op Vital Signs: stable  Last Vitals:  Filed Vitals:   11/15/13 0519  BP: 127/76  Pulse: 116  Temp: 37.2 C  Resp:     Complications: No apparent anesthesia complications

## 2013-11-21 ENCOUNTER — Encounter (HOSPITAL_COMMUNITY): Payer: Self-pay | Admitting: *Deleted

## 2013-11-21 MED ORDER — CHLORHEXIDINE GLUCONATE 4 % EX LIQD
60.0000 mL | Freq: Once | CUTANEOUS | Status: DC
  Filled 2013-11-21: qty 60

## 2013-11-21 MED ORDER — CEFAZOLIN SODIUM-DEXTROSE 2-3 GM-% IV SOLR: 2.0000 g | INTRAVENOUS | Status: DC
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
## 2013-11-22 ENCOUNTER — Encounter (HOSPITAL_COMMUNITY): Payer: BC Managed Care – PPO | Admitting: Anesthesiology

## 2013-11-22 ENCOUNTER — Encounter (HOSPITAL_COMMUNITY): Payer: Self-pay | Admitting: Anesthesiology

## 2013-11-22 ENCOUNTER — Inpatient Hospital Stay (HOSPITAL_COMMUNITY): Payer: BC Managed Care – PPO

## 2013-11-22 ENCOUNTER — Inpatient Hospital Stay (HOSPITAL_COMMUNITY): Payer: BC Managed Care – PPO | Admitting: Anesthesiology

## 2013-11-22 ENCOUNTER — Inpatient Hospital Stay (HOSPITAL_COMMUNITY)
Admission: RE | Admit: 2013-11-22 | Discharge: 2013-11-24 | DRG: 505 | Disposition: A | Discharge status: Skilled Nursing Facility | Payer: BC Managed Care – PPO | Source: Ambulatory Visit | Attending: Orthopedic Surgery | Admitting: Orthopedic Surgery

## 2013-11-22 ENCOUNTER — Encounter (HOSPITAL_COMMUNITY)
Admission: RE | Disposition: A | Discharge status: Skilled Nursing Facility | Payer: Self-pay | Source: Ambulatory Visit | Attending: Orthopedic Surgery

## 2013-11-22 ENCOUNTER — Ambulatory Visit (HOSPITAL_COMMUNITY): Payer: BC Managed Care – PPO

## 2013-11-22 DIAGNOSIS — S92009A Unspecified fracture of unspecified calcaneus, initial encounter for closed fracture: Secondary | ICD-10-CM | POA: Diagnosis present

## 2013-11-22 DIAGNOSIS — S92001A Unspecified fracture of right calcaneus, initial encounter for closed fracture: Secondary | ICD-10-CM | POA: Diagnosis present

## 2013-11-22 DIAGNOSIS — S62306A Unspecified fracture of fifth metacarpal bone, right hand, initial encounter for closed fracture: Secondary | ICD-10-CM | POA: Diagnosis present

## 2013-11-22 DIAGNOSIS — S62319A Displaced fracture of base of unspecified metacarpal bone, initial encounter for closed fracture: Secondary | ICD-10-CM | POA: Diagnosis present

## 2013-11-22 DIAGNOSIS — S92001D Unspecified fracture of right calcaneus, subsequent encounter for fracture with routine healing: Secondary | ICD-10-CM

## 2013-11-22 DIAGNOSIS — S32402D Unspecified fracture of left acetabulum, subsequent encounter for fracture with routine healing: Secondary | ICD-10-CM

## 2013-11-22 DIAGNOSIS — D62 Acute posthemorrhagic anemia: Secondary | ICD-10-CM | POA: Diagnosis present

## 2013-11-22 DIAGNOSIS — S32402A Unspecified fracture of left acetabulum, initial encounter for closed fracture: Secondary | ICD-10-CM | POA: Diagnosis present

## 2013-11-22 DIAGNOSIS — S42401A Unspecified fracture of lower end of right humerus, initial encounter for closed fracture: Secondary | ICD-10-CM | POA: Diagnosis present

## 2013-11-22 HISTORY — PX: ORIF CALCANEOUS FRACTURE: SHX5030

## 2013-11-22 HISTORY — PX: CLOSED REDUCTION FINGER WITH PERCUTANEOUS PINNING: SHX5612

## 2013-11-22 HISTORY — DX: Anemia, unspecified: D64.9

## 2013-11-22 LAB — CBC
HCT: 26.7 % — ABNORMAL LOW (ref 39.0–52.0)
HEMATOCRIT: 26.2 % — AB (ref 39.0–52.0)
Hemoglobin: 9 g/dL — ABNORMAL LOW (ref 13.0–17.0)
Hemoglobin: 8.8 g/dL — ABNORMAL LOW (ref 13.0–17.0)
MCH: 28.9 pg (ref 26.0–34.0)
MCH: 27.9 pg (ref 26.0–34.0)
MCHC: 34.4 g/dL (ref 30.0–36.0)
MCHC: 33 g/dL (ref 30.0–36.0)
MCV: 84.2 fL (ref 78.0–100.0)
MCV: 84.8 fL (ref 78.0–100.0)
PLATELETS: 561 10*3/uL — AB (ref 150–400)
Platelets: 543 10*3/uL — ABNORMAL HIGH (ref 150–400)
RBC: 3.11 MIL/uL — AB (ref 4.22–5.81)
RBC: 3.15 MIL/uL — ABNORMAL LOW (ref 4.22–5.81)
RDW: 12.8 % (ref 11.5–15.5)
RDW: 12.9 % (ref 11.5–15.5)
WBC: 4.9 10*3/uL (ref 4.0–10.5)
WBC: 7 10*3/uL (ref 4.0–10.5)

## 2013-11-22 LAB — PROTIME-INR
INR: 1.29 (ref 0.00–1.49)
PROTHROMBIN TIME: 16.1 s — AB (ref 11.6–15.2)

## 2013-11-22 LAB — CREATININE, SERUM: Creatinine, Ser: 0.63 mg/dL (ref 0.50–1.35)

## 2013-11-22 LAB — BASIC METABOLIC PANEL
ANION GAP: 14 (ref 5–15)
BUN: 11 mg/dL (ref 6–23)
CHLORIDE: 98 meq/L (ref 96–112)
CO2: 24 mEq/L (ref 19–32)
CREATININE: 0.66 mg/dL (ref 0.50–1.35)
Calcium: 8.7 mg/dL (ref 8.4–10.5)
GFR calc Af Amer: >90 mL/min (ref >90)
GFR calc non Af Amer: >90 mL/min (ref >90)
Glucose, Bld: 95 mg/dL (ref 70–99)
Potassium: 4.2 mEq/L (ref 3.7–5.3)
Sodium: 136 mEq/L — ABNORMAL LOW (ref 137–147)

## 2013-11-22 SURGERY — CLOSED REDUCTION, FINGER, WITH PERCUTANEOUS PINNING
Anesthesia: General | Site: Finger | Laterality: Right

## 2013-11-22 MED ORDER — LACTATED RINGERS IV SOLN
INTRAVENOUS | Status: DC | PRN
  Administered 2013-11-22 (×2): via INTRAVENOUS

## 2013-11-22 MED ORDER — FENTANYL CITRATE 0.05 MG/ML IJ SOLN
INTRAMUSCULAR | Status: AC
  Filled 2013-11-22: qty 5

## 2013-11-22 MED ORDER — ONDANSETRON HCL 4 MG/2ML IJ SOLN: 4.0000 mg | Freq: Four times a day (QID) | INTRAMUSCULAR | Status: DC | PRN

## 2013-11-22 MED ORDER — BISACODYL 5 MG PO TBEC: 5.0000 mg | DELAYED_RELEASE_TABLET | Freq: Every day | ORAL | Status: DC | PRN

## 2013-11-22 MED ORDER — BUPIVACAINE HCL (PF) 0.25 % IJ SOLN
INTRAMUSCULAR | Status: DC | PRN
  Administered 2013-11-22: 8 mL

## 2013-11-22 MED ORDER — PROPOFOL 10 MG/ML IV BOLUS
INTRAVENOUS | Status: AC
  Filled 2013-11-22: qty 20

## 2013-11-22 MED ORDER — METHOCARBAMOL 500 MG PO TABS
500.0000 mg | ORAL_TABLET | Freq: Four times a day (QID) | ORAL | Status: DC | PRN
  Administered 2013-11-22: 500 mg via ORAL
  Administered 2013-11-23 – 2013-11-24 (×5): 1000 mg via ORAL
  Filled 2013-11-22 (×4): qty 2
  Filled 2013-11-22: qty 1
  Filled 2013-11-22 (×2): qty 2

## 2013-11-22 MED ORDER — METOCLOPRAMIDE HCL 10 MG PO TABS
5.0000 mg | ORAL_TABLET | Freq: Three times a day (TID) | ORAL | Status: DC | PRN
Start: 2013-11-22 — End: 2013-11-24

## 2013-11-22 MED ORDER — HYDROMORPHONE HCL PF 1 MG/ML IJ SOLN
0.2500 mg | INTRAMUSCULAR | Status: DC | PRN
  Administered 2013-11-22: 0.5 mg via INTRAVENOUS
  Administered 2013-11-22: 0.25 mg via INTRAVENOUS
  Administered 2013-11-22: 0.5 mg via INTRAVENOUS
  Administered 2013-11-22: 0.75 mg via INTRAVENOUS

## 2013-11-22 MED ORDER — FENTANYL CITRATE 0.05 MG/ML IJ SOLN
INTRAMUSCULAR | Status: AC
Start: 2013-11-22 — End: 2013-11-22
  Filled 2013-11-22: qty 5

## 2013-11-22 MED ORDER — OXYCODONE-ACETAMINOPHEN 5-325 MG PO TABS
1.0000 | ORAL_TABLET | ORAL | Status: DC | PRN
  Administered 2013-11-22 – 2013-11-24 (×7): 2 via ORAL
  Filled 2013-11-22 (×7): qty 2

## 2013-11-22 MED ORDER — BUPIVACAINE HCL (PF) 0.25 % IJ SOLN
INTRAMUSCULAR | Status: AC
  Filled 2013-11-22: qty 30

## 2013-11-22 MED ORDER — METHOCARBAMOL 500 MG PO TABS
ORAL_TABLET | ORAL | Status: AC
  Filled 2013-11-22: qty 1

## 2013-11-22 MED ORDER — FERROUS SULFATE 325 (65 FE) MG PO TABS
325.0000 mg | ORAL_TABLET | Freq: Three times a day (TID) | ORAL | Status: DC
  Administered 2013-11-22 – 2013-11-24 (×5): 325 mg via ORAL
  Filled 2013-11-22 (×9): qty 1

## 2013-11-22 MED ORDER — ONDANSETRON HCL 4 MG/2ML IJ SOLN
INTRAMUSCULAR | Status: DC | PRN
  Administered 2013-11-22: 4 mg via INTRAVENOUS

## 2013-11-22 MED ORDER — SENNOSIDES-DOCUSATE SODIUM 8.6-50 MG PO TABS: 1.0000 | ORAL_TABLET | Freq: Every evening | ORAL | Status: DC | PRN

## 2013-11-22 MED ORDER — NEOSTIGMINE METHYLSULFATE 10 MG/10ML IV SOLN
INTRAVENOUS | Status: DC | PRN
  Administered 2013-11-22: 3 mg via INTRAVENOUS

## 2013-11-22 MED ORDER — MIDAZOLAM HCL 2 MG/2ML IJ SOLN
INTRAMUSCULAR | Status: AC
Start: 2013-11-22 — End: 2013-11-22
  Filled 2013-11-22: qty 2

## 2013-11-22 MED ORDER — ROCURONIUM BROMIDE 50 MG/5ML IV SOLN
INTRAVENOUS | Status: AC
  Filled 2013-11-22: qty 1

## 2013-11-22 MED ORDER — OXYCODONE-ACETAMINOPHEN 5-325 MG PO TABS
ORAL_TABLET | ORAL | Status: AC
  Filled 2013-11-22: qty 2

## 2013-11-22 MED ORDER — CEFAZOLIN SODIUM-DEXTROSE 2-3 GM-% IV SOLR
INTRAVENOUS | Status: AC
  Filled 2013-11-22: qty 50

## 2013-11-22 MED ORDER — SODIUM CHLORIDE 0.9 % IJ SOLN
INTRAMUSCULAR | Status: AC
  Filled 2013-11-22: qty 10

## 2013-11-22 MED ORDER — EPHEDRINE SULFATE 50 MG/ML IJ SOLN
INTRAMUSCULAR | Status: AC
  Filled 2013-11-22: qty 1

## 2013-11-22 MED ORDER — HYDROMORPHONE HCL PF 1 MG/ML IJ SOLN
INTRAMUSCULAR | Status: AC
  Administered 2013-11-22: 0.75 mg via INTRAVENOUS
  Filled 2013-11-22: qty 1

## 2013-11-22 MED ORDER — COUMADIN BOOK
1.0000 | Freq: Once | Status: AC
  Administered 2013-11-22: 1
  Filled 2013-11-22: qty 1

## 2013-11-22 MED ORDER — LORAZEPAM 0.5 MG PO TABS
0.5000 mg | ORAL_TABLET | Freq: Three times a day (TID) | ORAL | Status: DC | PRN
  Administered 2013-11-22 – 2013-11-24 (×5): 0.5 mg via ORAL
  Filled 2013-11-22 (×5): qty 1

## 2013-11-22 MED ORDER — METHOCARBAMOL 1000 MG/10ML IJ SOLN: 500.0000 mg | Freq: Four times a day (QID) | INTRAVENOUS | Status: DC | PRN

## 2013-11-22 MED ORDER — ONDANSETRON HCL 4 MG/2ML IJ SOLN: 4.0000 mg | Freq: Once | INTRAMUSCULAR | Status: DC | PRN

## 2013-11-22 MED ORDER — PHENYLEPHRINE 40 MCG/ML (10ML) SYRINGE FOR IV PUSH (FOR BLOOD PRESSURE SUPPORT)
PREFILLED_SYRINGE | INTRAVENOUS | Status: AC
  Filled 2013-11-22: qty 10

## 2013-11-22 MED ORDER — WARFARIN SODIUM 7.5 MG PO TABS
7.5000 mg | ORAL_TABLET | Freq: Once | ORAL | Status: AC
  Administered 2013-11-22: 7.5 mg via ORAL
  Filled 2013-11-22: qty 1

## 2013-11-22 MED ORDER — LIDOCAINE HCL (CARDIAC) 20 MG/ML IV SOLN
INTRAVENOUS | Status: AC
  Filled 2013-11-22: qty 5

## 2013-11-22 MED ORDER — GLYCOPYRROLATE 0.2 MG/ML IJ SOLN
INTRAMUSCULAR | Status: DC | PRN
  Administered 2013-11-22: 0.4 mg via INTRAVENOUS

## 2013-11-22 MED ORDER — HYDROMORPHONE HCL PF 1 MG/ML IJ SOLN
0.5000 mg | INTRAMUSCULAR | Status: DC | PRN
Start: 2013-11-22 — End: 2013-11-24
  Administered 2013-11-22 – 2013-11-23 (×6): 1 mg via INTRAVENOUS
  Filled 2013-11-22 (×7): qty 1

## 2013-11-22 MED ORDER — WARFARIN - PHARMACIST DOSING INPATIENT: Freq: Every day | Status: DC

## 2013-11-22 MED ORDER — ONDANSETRON HCL 4 MG PO TABS: 4.0000 mg | ORAL_TABLET | Freq: Four times a day (QID) | ORAL | Status: DC | PRN

## 2013-11-22 MED ORDER — POTASSIUM CHLORIDE IN NACL 20-0.9 MEQ/L-% IV SOLN
INTRAVENOUS | Status: DC
  Administered 2013-11-22 – 2013-11-23 (×2): via INTRAVENOUS
  Filled 2013-11-22 (×4): qty 1000

## 2013-11-22 MED ORDER — ESCITALOPRAM OXALATE 10 MG PO TABS
10.0000 mg | ORAL_TABLET | Freq: Every day | ORAL | Status: DC
  Administered 2013-11-22 – 2013-11-24 (×3): 10 mg via ORAL
  Filled 2013-11-22 (×3): qty 1

## 2013-11-22 MED ORDER — PROPOFOL 10 MG/ML IV BOLUS
INTRAVENOUS | Status: DC | PRN
  Administered 2013-11-22: 200 mg via INTRAVENOUS

## 2013-11-22 MED ORDER — METOCLOPRAMIDE HCL 5 MG/ML IJ SOLN: 5.0000 mg | Freq: Three times a day (TID) | INTRAMUSCULAR | Status: DC | PRN

## 2013-11-22 MED ORDER — ENOXAPARIN SODIUM 40 MG/0.4ML ~~LOC~~ SOLN
40.0000 mg | SUBCUTANEOUS | Status: DC
  Administered 2013-11-23: 40 mg via SUBCUTANEOUS
  Filled 2013-11-22 (×3): qty 0.4

## 2013-11-22 MED ORDER — CEFAZOLIN SODIUM 1-5 GM-% IV SOLN
1.0000 g | Freq: Four times a day (QID) | INTRAVENOUS | Status: AC
  Administered 2013-11-22 – 2013-11-23 (×3): 1 g via INTRAVENOUS
  Filled 2013-11-22 (×4): qty 50

## 2013-11-22 MED ORDER — DOCUSATE SODIUM 100 MG PO CAPS
100.0000 mg | ORAL_CAPSULE | Freq: Two times a day (BID) | ORAL | Status: DC
  Administered 2013-11-22 – 2013-11-23 (×3): 100 mg via ORAL
  Filled 2013-11-22 (×5): qty 1

## 2013-11-22 MED ORDER — WARFARIN VIDEO
1.0000 | Freq: Once | Status: AC
  Administered 2013-11-23: 1

## 2013-11-22 MED ORDER — FENTANYL CITRATE 0.05 MG/ML IJ SOLN
INTRAMUSCULAR | Status: DC | PRN
  Administered 2013-11-22: 50 ug via INTRAVENOUS
  Administered 2013-11-22 (×4): 100 ug via INTRAVENOUS
  Administered 2013-11-22: 50 ug via INTRAVENOUS
  Administered 2013-11-22: 100 ug via INTRAVENOUS
  Administered 2013-11-22: 150 ug via INTRAVENOUS

## 2013-11-22 MED ORDER — ROCURONIUM BROMIDE 100 MG/10ML IV SOLN
INTRAVENOUS | Status: DC | PRN
  Administered 2013-11-22: 50 mg via INTRAVENOUS

## 2013-11-22 MED ORDER — POLYETHYLENE GLYCOL 3350 17 G PO PACK
17.0000 g | PACK | Freq: Every day | ORAL | Status: DC
  Administered 2013-11-22 – 2013-11-23 (×2): 17 g via ORAL
  Filled 2013-11-22 (×3): qty 1

## 2013-11-22 MED ORDER — LIDOCAINE HCL (CARDIAC) 20 MG/ML IV SOLN
INTRAVENOUS | Status: DC | PRN
  Administered 2013-11-22: 60 mg via INTRAVENOUS

## 2013-11-22 MED ORDER — DOCUSATE SODIUM 100 MG PO CAPS
100.0000 mg | ORAL_CAPSULE | Freq: Two times a day (BID) | ORAL | Status: DC
  Administered 2013-11-22 – 2013-11-24 (×2): 100 mg via ORAL
  Filled 2013-11-22 (×5): qty 1

## 2013-11-22 MED ORDER — PANTOPRAZOLE SODIUM 40 MG PO TBEC
40.0000 mg | DELAYED_RELEASE_TABLET | Freq: Every day | ORAL | Status: DC
  Administered 2013-11-22 – 2013-11-24 (×3): 40 mg via ORAL
  Filled 2013-11-22 (×2): qty 1

## 2013-11-22 MED ORDER — HYDROMORPHONE HCL PF 1 MG/ML IJ SOLN
INTRAMUSCULAR | Status: AC
  Administered 2013-11-22: 0.5 mg via INTRAVENOUS
  Filled 2013-11-22: qty 1

## 2013-11-22 MED ORDER — OXYCODONE HCL 5 MG PO TABS
5.0000 mg | ORAL_TABLET | ORAL | Status: DC | PRN
  Administered 2013-11-22 – 2013-11-24 (×10): 10 mg via ORAL
  Filled 2013-11-22 (×10): qty 2

## 2013-11-22 MED ORDER — ONDANSETRON HCL 4 MG/2ML IJ SOLN
INTRAMUSCULAR | Status: AC
Start: 2013-11-22 — End: 2013-11-22
  Filled 2013-11-22: qty 2

## 2013-11-22 MED ORDER — MIDAZOLAM HCL 5 MG/5ML IJ SOLN
INTRAMUSCULAR | Status: DC | PRN
  Administered 2013-11-22: 1 mg via INTRAVENOUS

## 2013-11-22 SURGICAL SUPPLY — 102 items
BANDAGE ELASTIC 3 VELCRO ST LF (GAUZE/BANDAGES/DRESSINGS) IMPLANT
BANDAGE ELASTIC 4 VELCRO ST LF (GAUZE/BANDAGES/DRESSINGS) ×4 IMPLANT
BANDAGE ELASTIC 6 VELCRO ST LF (GAUZE/BANDAGES/DRESSINGS) ×4 IMPLANT
BANDAGE ESMARK 6X9 LF (GAUZE/BANDAGES/DRESSINGS) ×2 IMPLANT
BIT DRILL 2.5X2.75 QC CALB (BIT) ×4 IMPLANT
BLADE SURG 10 STRL SS (BLADE) ×4 IMPLANT
BNDG ADH 5X3 H2O RPLNT NS (GAUZE/BANDAGES/DRESSINGS) ×4
BNDG CMPR 9X4 STRL LF SNTH (GAUZE/BANDAGES/DRESSINGS) ×2
BNDG CMPR 9X6 STRL LF SNTH (GAUZE/BANDAGES/DRESSINGS) ×2
BNDG COHESIVE 1X5 TAN STRL LF (GAUZE/BANDAGES/DRESSINGS) IMPLANT
BNDG COHESIVE 3X5 WHT NS (GAUZE/BANDAGES/DRESSINGS) ×8 IMPLANT
BNDG COHESIVE 4X5 TAN STRL (GAUZE/BANDAGES/DRESSINGS) ×4 IMPLANT
BNDG ESMARK 4X9 LF (GAUZE/BANDAGES/DRESSINGS) ×4 IMPLANT
BNDG ESMARK 6X9 LF (GAUZE/BANDAGES/DRESSINGS) ×4
BNDG GAUZE ELAST 4 BULKY (GAUZE/BANDAGES/DRESSINGS) ×8 IMPLANT
BRUSH SCRUB DISP (MISCELLANEOUS) ×8 IMPLANT
CLOSURE WOUND 1/2 X4 (GAUZE/BANDAGES/DRESSINGS)
CORDS BIPOLAR (ELECTRODE) ×4 IMPLANT
COVER MAYO STAND STRL (DRAPES) ×4 IMPLANT
COVER SURGICAL LIGHT HANDLE (MISCELLANEOUS) ×12 IMPLANT
CUFF TOURNIQUET SINGLE 18IN (TOURNIQUET CUFF) IMPLANT
CUFF TOURNIQUET SINGLE 24IN (TOURNIQUET CUFF) IMPLANT
DRAIN TLS ROUND 10FR (DRAIN) ×4 IMPLANT
DRAPE C-ARM 42X72 X-RAY (DRAPES) ×4 IMPLANT
DRAPE C-ARMOR (DRAPES) ×4 IMPLANT
DRAPE OEC MINIVIEW 54X84 (DRAPES) IMPLANT
DRAPE ORTHO SPLIT 77X108 STRL (DRAPES) ×4
DRAPE SURG 17X23 STRL (DRAPES) ×4 IMPLANT
DRAPE SURG ORHT 6 SPLT 77X108 (DRAPES) ×2 IMPLANT
DRAPE U-SHAPE 47X51 STRL (DRAPES) ×4 IMPLANT
DRSG ADAPTIC 3X8 NADH LF (GAUZE/BANDAGES/DRESSINGS) ×4 IMPLANT
DRSG EMULSION OIL 3X3 NADH (GAUZE/BANDAGES/DRESSINGS) ×4 IMPLANT
DURAPREP 26ML APPLICATOR (WOUND CARE) ×4 IMPLANT
ELECT REM PT RETURN 9FT ADLT (ELECTROSURGICAL) ×4
ELECTRODE REM PT RTRN 9FT ADLT (ELECTROSURGICAL) ×2 IMPLANT
GAUZE SPONGE 2X2 8PLY STRL LF (GAUZE/BANDAGES/DRESSINGS) IMPLANT
GAUZE SPONGE 4X4 12PLY STRL (GAUZE/BANDAGES/DRESSINGS) ×4 IMPLANT
GAUZE XEROFORM 1X8 LF (GAUZE/BANDAGES/DRESSINGS) ×4 IMPLANT
GLOVE BIO SURGEON STRL SZ7.5 (GLOVE) ×4 IMPLANT
GLOVE BIO SURGEON STRL SZ8 (GLOVE) ×4 IMPLANT
GLOVE BIO SURGEON STRL SZ8.5 (GLOVE) ×4 IMPLANT
GLOVE BIOGEL PI IND STRL 7.5 (GLOVE) ×2 IMPLANT
GLOVE BIOGEL PI INDICATOR 7.5 (GLOVE) ×2
GOWN STRL REUS W/ TWL LRG LVL3 (GOWN DISPOSABLE) ×6 IMPLANT
GOWN STRL REUS W/ TWL XL LVL3 (GOWN DISPOSABLE) ×6 IMPLANT
GOWN STRL REUS W/TWL LRG LVL3 (GOWN DISPOSABLE) ×12
GOWN STRL REUS W/TWL XL LVL3 (GOWN DISPOSABLE) ×12
K-WIRE ACE 1.6X6 (WIRE) ×16
K-WIRE DBL TROCAR .045X4 ×12 IMPLANT
KIT BASIN OR (CUSTOM PROCEDURE TRAY) ×8 IMPLANT
KIT ROOM TURNOVER OR (KITS) ×8 IMPLANT
KWIRE ACE 1.6X6 (WIRE) ×8 IMPLANT
KWIRE DBL TROCAR .045X4 ×6 IMPLANT
MANIFOLD NEPTUNE II (INSTRUMENTS) ×8 IMPLANT
NEEDLE HYPO 21X1.5 SAFETY (NEEDLE) IMPLANT
NEEDLE HYPO 25GX1X1/2 BEV (NEEDLE) IMPLANT
NEEDLE HYPO 25X1 1.5 SAFETY (NEEDLE) ×4 IMPLANT
NS IRRIG 1000ML POUR BTL (IV SOLUTION) ×8 IMPLANT
PACK ORTHO EXTREMITY (CUSTOM PROCEDURE TRAY) ×8 IMPLANT
PAD ARMBOARD 7.5X6 YLW CONV (MISCELLANEOUS) ×16 IMPLANT
PAD CAST 3X4 CTTN HI CHSV (CAST SUPPLIES) ×4 IMPLANT
PAD CAST 4YDX4 CTTN HI CHSV (CAST SUPPLIES) ×4 IMPLANT
PADDING CAST COTTON 3X4 STRL (CAST SUPPLIES) ×8
PADDING CAST COTTON 4X4 STRL (CAST SUPPLIES) ×8
PADDING CAST COTTON 6X4 STRL (CAST SUPPLIES) ×4 IMPLANT
PENCIL BUTTON HOLSTER BLD 10FT (ELECTRODE) ×4 IMPLANT
PLATE ACE PERIMETER LRG (Plate) ×4 IMPLANT
SCREW CORT 3.5X30 815037030 (Screw) ×8 IMPLANT
SCREW CORT 3.5X32 815037032 (Screw) ×4 IMPLANT
SCREW CORT 3.5X40 815037040 (Screw) ×4 IMPLANT
SCREW CORT 3.5X44 815037044 (Screw) ×4 IMPLANT
SCREW CORTICAL 3.5MM  28MM (Screw) ×2 IMPLANT
SCREW CORTICAL 3.5MM  34MM (Screw) ×2 IMPLANT
SCREW CORTICAL 3.5MM  42MM (Screw) ×4 IMPLANT
SCREW CORTICAL 3.5MM 28MM (Screw) ×2 IMPLANT
SCREW CORTICAL 3.5MM 34MM (Screw) ×2 IMPLANT
SCREW CORTICAL 3.5MM 36MM (Screw) ×12 IMPLANT
SCREW CORTICAL 3.5MM 38MM (Screw) ×4 IMPLANT
SCREW CORTICAL 3.5MM 42MM (Screw) ×4 IMPLANT
SCREW CORTICAL 3.5MM 48MM (Screw) ×4 IMPLANT
SPONGE GAUZE 2X2 STER 10/PKG (GAUZE/BANDAGES/DRESSINGS)
SPONGE GAUZE 4X4 12PLY STER LF (GAUZE/BANDAGES/DRESSINGS) ×8 IMPLANT
SPONGE LAP 18X18 X RAY DECT (DISPOSABLE) ×12 IMPLANT
SPONGE SCRUB IODOPHOR (GAUZE/BANDAGES/DRESSINGS) ×4 IMPLANT
STRIP CLOSURE SKIN 1/2X4 (GAUZE/BANDAGES/DRESSINGS) IMPLANT
SUCTION FRAZIER TIP 10 FR DISP (SUCTIONS) ×8 IMPLANT
SUT ETHILON 3 0 PS 1 (SUTURE) ×8 IMPLANT
SUT ETHILON 4 0 PS 2 18 (SUTURE) IMPLANT
SUT ETHILON 5 0 PS 2 18 (SUTURE) IMPLANT
SUT PROLENE 3 0 PS 2 (SUTURE) IMPLANT
SUT VIC AB 2-0 CT3 27 (SUTURE) IMPLANT
SUT VIC AB 2-0 SH 18 (SUTURE) ×4 IMPLANT
SUT VIC AB 3-0 FS2 27 (SUTURE) ×4 IMPLANT
SUT VIC AB 4-0 P-3 18X BRD (SUTURE) IMPLANT
SUT VIC AB 4-0 P3 18 (SUTURE)
SYR CONTROL 10ML LL (SYRINGE) ×4 IMPLANT
TOWEL OR 17X24 6PK STRL BLUE (TOWEL DISPOSABLE) ×8 IMPLANT
TOWEL OR 17X26 10 PK STRL BLUE (TOWEL DISPOSABLE) ×12 IMPLANT
TUBE CONNECTING 12'X1/4 (SUCTIONS) ×2
TUBE CONNECTING 12X1/4 (SUCTIONS) ×6 IMPLANT
UNDERPAD 30X30 INCONTINENT (UNDERPADS AND DIAPERS) ×8 IMPLANT
WATER STERILE IRR 1000ML POUR (IV SOLUTION) ×8 IMPLANT

## 2013-11-22 NOTE — Progress Notes (Signed)
11/22/13  Pharmacy- Coumadin 1810   Baseline INR 1.29  A/P:  Baseline INR returned wnl.  Will give Coumadin 7.5mg  and f/u daily INR.    Marisue Humble, PharmD Clinical Pharmacist [facility]

## 2013-11-22 NOTE — Op Note (Signed)
NAMEZYRION, COEY NO.:  [id number]  MEDICAL RECORD NO.:  [id number]  LOCATION:  [facility]                         FACILITY:  [facility]  PHYSICIAN:  Artist Pais. Tanicka Bisaillon, M.D.DATE OF BIRTH:  03-Oct-1955  DATE OF PROCEDURE:  11/22/2013 DATE OF DISCHARGE:                              OPERATIVE REPORT   PREOPERATIVE DIAGNOSIS:  Reverse Bennett's fracture of right small finger.  POSTOPERATIVE DIAGNOSIS:  Reverse Bennett's fracture of right small finger.  PROCEDURE:  Closed reduction and percutaneous pinning of the above.  SURGEON:  Artist Pais. Mina Marble, M.D.  ASSISTANT:  None.  ANESTHESIA:  General.  COMPLICATION:  None.  DRAINS:  None.  PROCEDURE IN DETAIL:  The patient was taken to the operating suite. After induction of adequate general anesthetic, right upper extremity was prepped and draped in sterile fashion.  An Esmarch was used to exsanguinate the limb.  Tourniquet was inflated to 250 mmHg.  At this point, a longitudinal traction with downward pressure was placed at the base of the small metacarpal of the [facility] joint.  Intraoperative fluoroscopy revealed reduction of the fracture dislocation of the [facility] joint.  Three 0.045 K-wires were driven from ulnar to radial, two below the fracture site, one above the fracture site into the base of the ring metacarpal to provide stabilization of the [facility] joint.  Intraoperative fluoroscopy revealed adequate reduction in AP, lateral, and oblique views.  The K-wires were cut outside of the skin, bent upon themselves, dressed with Xeroform, 4x4s, fluffs, and a compression dressing.  The patient underwent further operative fixation of calcaneus fracture by Dr. Myrene Galas of the Trauma Service, and his note will be dictated later.     Artist Pais Mina Marble, M.D.     MAW/MEDQ  D:  11/22/2013  T:  11/22/2013  Job:  [id number]
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
## 2013-11-22 NOTE — Brief Op Note (Signed)
11/22/2013  10:50 AM  PATIENT:  Ria Bush  58 y.o. male  PRE-OPERATIVE DIAGNOSIS:  RIGHT CALCANEUS FRACTURE  POST-OPERATIVE DIAGNOSIS:  RIGHT CALCANEUS FRACTURE  PROCEDURE:  Procedure(s) with comments: OPEN REDUCTION INTERNAL FIXATION (ORIF) CALCANEUS FRACTURE (Right) - Shaunessy Dobratz CLOSED REDUCTION PERCUTANEOUS PINNING  (Right) [place]  SURGEON:  Surgeon(s) and Role: Panel 1:    * Budd Palmer, MD - Primary  Panel 2:    * Dairl Ponder, MD - Primary  PHYSICIAN ASSISTANT: None  ANESTHESIA:   general  I/O:  Total I/O In: 1100 [I.V.:1100] Out: -   SPECIMEN:  No Specimen  TOURNIQUET:   Right leg: 120 minutes  DICTATION: .Other Dictation: Dictation Number [id number]
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
## 2013-11-22 NOTE — Progress Notes (Signed)
Removed adult diaper d/t leakage of urine / changed bed and linen / repositioned pt

## 2013-11-22 NOTE — Op Note (Signed)
See note [phone]

## 2013-11-22 NOTE — Anesthesia Postprocedure Evaluation (Signed)
  Anesthesia Post-op Note  Patient: Jerry Holland  Procedure(s) Performed: Procedure(s) with comments: CLOSED REDUCTION PERCUTANEOUS PINNING  (Right) OPEN REDUCTION INTERNAL FIXATION (ORIF) CALCANEOUS FRACTURE (Right) - Biomet, Handy Bed, Carm  Patient Location: PACU  Anesthesia Type:General  Level of Consciousness: awake, alert , oriented and patient cooperative  Airway and Oxygen Therapy: Patient Spontanous Breathing  Post-op Pain: mild  Post-op Assessment: Post-op Vital signs reviewed, Patient's Cardiovascular Status Stable, Respiratory Function Stable, Patent Airway, No signs of Nausea or vomiting and Pain level controlled  Post-op Vital Signs: stable  Last Vitals:  Filed Vitals:   11/22/13 1333  BP:   Pulse:   Temp:   Resp: 12    Complications: No apparent anesthesia complications
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
## 2013-11-22 NOTE — Progress Notes (Signed)
Utilization review completed.  

## 2013-11-22 NOTE — Progress Notes (Signed)
OT Cancellation Note  Patient Details Name: Uriah Philipson MRN: [id number] DOB: 02/12/1956   Cancelled Treatment:    Reason Eval/Treat Not Completed: Patient at procedure or test/ unavailable. OT will follow up to evaluate as available.  Rae Lips [phone] 11/22/2013, 3:56 PM
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
## 2013-11-22 NOTE — Transfer of Care (Signed)
Immediate Anesthesia Transfer of Care Note  Patient: Jerry Holland  Procedure(s) Performed: Procedure(s) with comments: CLOSED REDUCTION PERCUTANEOUS PINNING  (Right) OPEN REDUCTION INTERNAL FIXATION (ORIF) CALCANEOUS FRACTURE (Right) - Biomet, Handy Bed, Carm  Patient Location: PACU  Anesthesia Type:General  Level of Consciousness: awake, alert , oriented and patient cooperative  Airway & Oxygen Therapy: Patient Spontanous Breathing and Patient connected to nasal cannula oxygen  Post-op Assessment: Report given to PACU RN, Post -op Vital signs reviewed and stable and Patient moving all extremities  Post vital signs: Reviewed and stable  Complications: No apparent anesthesia complications

## 2013-11-22 NOTE — Progress Notes (Signed)
Orthopedic Tech Progress Note Patient Details:  Jerry Holland 1955/10/04 [id number]  Ortho Devices Ortho Device/Splint Location: trapeze bar patient helper Ortho Device/Splint Interventions: Application   [name] 11/22/2013, 4:20 PM

## 2013-11-23 ENCOUNTER — Encounter (HOSPITAL_COMMUNITY): Payer: Self-pay | Admitting: Orthopedic Surgery

## 2013-11-23 LAB — CBC
HCT: 26.2 % — ABNORMAL LOW (ref 39.0–52.0)
Hemoglobin: 8.7 g/dL — ABNORMAL LOW (ref 13.0–17.0)
MCH: 28.2 pg (ref 26.0–34.0)
MCHC: 33.2 g/dL (ref 30.0–36.0)
MCV: 84.8 fL (ref 78.0–100.0)
Platelets: 576 10*3/uL — ABNORMAL HIGH (ref 150–400)
RBC: 3.09 MIL/uL — ABNORMAL LOW (ref 4.22–5.81)
RDW: 13 % (ref 11.5–15.5)
WBC: 6.7 10*3/uL (ref 4.0–10.5)

## 2013-11-23 LAB — PROTIME-INR
INR: 1.48 (ref 0.00–1.49)
Prothrombin Time: 17.9 seconds — ABNORMAL HIGH (ref 11.6–15.2)

## 2013-11-23 LAB — BASIC METABOLIC PANEL
ANION GAP: 12 (ref 5–15)
BUN: 12 mg/dL (ref 6–23)
CALCIUM: 8.4 mg/dL (ref 8.4–10.5)
CO2: 23 meq/L (ref 19–32)
CREATININE: 0.61 mg/dL (ref 0.50–1.35)
Chloride: 97 mEq/L (ref 96–112)
GFR calc non Af Amer: >90 mL/min (ref >90)
Glucose, Bld: 108 mg/dL — ABNORMAL HIGH (ref 70–99)
Potassium: 4.8 mEq/L (ref 3.7–5.3)
Sodium: 132 mEq/L — ABNORMAL LOW (ref 137–147)

## 2013-11-23 MED ORDER — OXYCODONE HCL 5 MG PO TABS: 5.0000 mg | ORAL_TABLET | ORAL | Status: AC | PRN

## 2013-11-23 MED ORDER — METHOCARBAMOL 500 MG PO TABS: 500.0000 mg | ORAL_TABLET | Freq: Four times a day (QID) | ORAL | Status: AC | PRN

## 2013-11-23 MED ORDER — FERROUS SULFATE 325 (65 FE) MG PO TABS: 325.0000 mg | ORAL_TABLET | Freq: Three times a day (TID) | ORAL | Status: AC

## 2013-11-23 MED ORDER — WARFARIN SODIUM 5 MG PO TABS
5.0000 mg | ORAL_TABLET | Freq: Once | ORAL | Status: AC
  Administered 2013-11-23: 5 mg via ORAL
  Filled 2013-11-23: qty 1

## 2013-11-23 MED ORDER — OXYCODONE-ACETAMINOPHEN 5-325 MG PO TABS: 1.0000 | ORAL_TABLET | ORAL | Status: AC | PRN
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
## 2013-11-23 NOTE — Clinical Social Work Placement (Addendum)
Clinical Social Work Department CLINICAL SOCIAL WORK PLACEMENT NOTE 11/23/2013  Patient:  Jerry Holland, Jerry Holland  Account Number:  [id number] Admit date:  11/22/2013  Clinical Social Worker:  Derrell Lolling, LCSW  Date/time:  11/23/2013 04:15 PM  Clinical Social Work is seeking post-discharge placement for this patient at the following level of care:   SKILLED NURSING   (*CSW will update this form in Epic as items are completed)   11/23/2013  Patient/family provided with [facility] Department of Clinical Social Work's list of facilities offering this level of care within the geographic area requested by the patient (or if unable, by the patient's family).  11/23/2013  Patient/family informed of their freedom to choose among providers that offer the needed level of care, that participate in Medicare, Medicaid or managed care program needed by the patient, have an available bed and are willing to accept the patient.  11/23/2013  Patient/family informed of [facility]' ownership interest in [facility], as well as of the fact that they are under no obligation to receive care at this facility.  PASARR submitted to EDS on 11/23/2013 PASARR number received on 11/23/2013  FL2 transmitted to all facilities in geographic area requested by pt/family on  11/23/2013 FL2 transmitted to all facilities within larger geographic area on 11/23/2013  Patient informed that his/her managed care company has contracts with or will negotiate with  certain facilities, including the following:     Patient/family informed of bed offers received:  11/23/2013 Patient chooses bed at [facility] Physician recommends and patient chooses bed at    Patient to be transferred to [facility] in [place] on 11/24/13   Patient to be transferred to facility by [facility] [place])  Patient and family notified of transfer on 11/24/13  Name of family member notified: Daughter Selena Batten at  bedside.     The following physician request were entered in Epic:   Additional Comments:   Derrell Lolling, MSW Clinical Social Worker [phone]

## 2013-11-23 NOTE — Progress Notes (Addendum)
Pt has [employer] and requires a pre-authorization. SW faxed clinicals to SNF and they will submit. Pt informed. SW spoke with SNF regarding updating FL2, facility reported that they will not need an updated FL2. SW will continue to follow.   Derrell Lolling, MSW Clinical Social Worker [phone]

## 2013-11-23 NOTE — Progress Notes (Signed)
Pt has complained of 10/10 pain.  He was able to sleep for 3 hrs tonight.  He would like to have his pain medications reviewed.  He states he does not get any relief.
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
## 2013-11-23 NOTE — Discharge Instructions (Addendum)
Orthopaedic Trauma Service Discharge Instructions,   General Discharge Instructions  WEIGHT BEARING STATUS: Nonweightbearing R LEx,TDWB Left LEx for transfers only, NWB R upper extremity, pt slide or lift transfer only   RANGE OF MOTION/ACTIVITY: do not remove splint on R ankle, posterior hip precautions Left Hip   Diet: as you were eating previously.  Can use over the counter stool softeners and bowel preparations, such as Miralax, to help with bowel movements.  Narcotics can be constipating.  Be sure to drink plenty of fluids  STOP SMOKING OR USING NICOTINE PRODUCTS!!!!  As discussed nicotine severely impairs your body's ability to heal surgical and traumatic wounds but also impairs bone healing.  Wounds and bone heal by forming microscopic blood vessels (angiogenesis) and nicotine is a vasoconstrictor (essentially, shrinks blood vessels).  Therefore, if vasoconstriction occurs to these microscopic blood vessels they essentially disappear and are unable to deliver necessary nutrients to the healing tissue.  This is one modifiable factor that you can do to dramatically increase your chances of healing your injury.    (This means no smoking, no nicotine gum, patches, etc)  DO NOT USE NONSTEROIDAL ANTI-INFLAMMATORY DRUGS (NSAID'S)  Using products such as Advil (ibuprofen), Aleve (naproxen), Motrin (ibuprofen) for additional pain control during fracture healing can delay and/or prevent the healing response.  If you would like to take over the counter (OTC) medication, Tylenol (acetaminophen) is ok.  However, some narcotic medications that are given for pain control contain acetaminophen as well. Therefore, you should not exceed more than 4000 mg of tylenol in a day if you do not have liver disease.  Also note that there are may OTC medicines, such as cold medicines and allergy medicines that my contain tylenol as well.  If you have any questions about medications and/or interactions please ask your  doctor/PA or your pharmacist.   PAIN MEDICATION USE AND EXPECTATIONS  You have likely been given narcotic medications to help control your pain.  After a traumatic event that results in an fracture (broken bone) with or without surgery, it is ok to use narcotic pain medications to help control one's pain.  We understand that everyone responds to pain differently and each individual patient will be evaluated on a regular basis for the continued need for narcotic medications. Ideally, narcotic medication use should last no more than 6-8 weeks (coinciding with fracture healing).   As a patient it is your responsibility as well to monitor narcotic medication use and report the amount and frequency you use these medications when you come to your office visit.   We would also advise that if you are using narcotic medications, you should take a dose prior to therapy to maximize you participation.  IF YOU ARE ON NARCOTIC MEDICATIONS IT IS NOT PERMISSIBLE TO OPERATE A MOTOR VEHICLE (MOTORCYCLE/CAR/TRUCK/MOPED) OR HEAVY MACHINERY DO NOT MIX NARCOTICS WITH OTHER CNS (CENTRAL NERVOUS SYSTEM) DEPRESSANTS SUCH AS ALCOHOL       ICE AND ELEVATE INJURED/OPERATIVE EXTREMITY  Using ice and elevating the injured extremity above your heart can help with swelling and pain control.  Icing in a pulsatile fashion, such as 20 minutes on and 20 minutes off, can be followed.    Do not place ice directly on skin. Make sure there is a barrier between to skin and the ice pack.    Using frozen items such as frozen peas works well as the conform nicely to the are that needs to be iced.  USE AN ACE WRAP OR TED  HOSE FOR SWELLING CONTROL  In addition to icing and elevation, Ace wraps or TED hose are used to help limit and resolve swelling.  It is recommended to use Ace wraps or TED hose until you are informed to stop.    When using Ace Wraps start the wrapping distally (farthest away from the body) and wrap proximally (closer to  the body)   Example: If you had surgery on your leg or thing and you do not have a splint on, start the ace wrap at the toes and work your way up to the thigh        If you had surgery on your upper extremity and do not have a splint on, start the ace wrap at your fingers and work your way up to the upper arm  IF YOU ARE IN A SPLINT OR CAST DO NOT REMOVE IT FOR ANY REASON   If your splint gets wet for any reason please contact the office immediately. You may shower in your splint or cast as long as you keep it dry.  This can be done by wrapping in a cast cover or garbage back (or similar)  Do Not stick any thing down your splint or cast such as pencils, money, or hangers to try and scratch yourself with.  If you feel itchy take benadryl as prescribed on the bottle for itching  IF YOU ARE IN A CAM BOOT (BLACK BOOT)  You may remove boot periodically. Perform daily dressing changes as noted below.  Wash the liner of the boot regularly and wear a sock when wearing the boot. It is recommended that you sleep in the boot until told otherwise  CALL THE OFFICE WITH ANY QUESTIONS OR CONCERTS: [phone]     Discharge Pin Site Instructions  Dress pins daily with Kerlix roll starting on POD 2. Wrap the Kerlix so that it tamps the skin down around the pin-skin interface to prevent/limit motion of the skin relative to the pin.  (Pin-skin motion is the primary cause of pain and infection related to external fixator pin sites).  Remove any crust or coagulum that may obstruct drainage with a saline moistened gauze or soap and water.  After POD 3, if there is no discernable drainage on the pin site dressing, the interval for change can by increased to every other day.  You may shower with the fixator, cleaning all pin sites gently with soap and water.  If you have a surgical wound this needs to be completely dry and without drainage before showering.  The extremity can be lifted by the fixator to facilitate  wound care and transfers.  Notify the office/Doctor if you experience increasing drainage, redness, or pain from a pin site, or if you notice purulent (thick, snot-like) drainage.  Discharge Wound Care Instructions  Do NOT apply any ointments, solutions or lotions to pin sites or surgical wounds.  These prevent needed drainage and even though solutions like hydrogen peroxide kill bacteria, they also damage cells lining the pin sites that help fight infection.  Applying lotions or ointments can keep the wounds moist and can cause them to breakdown and open up as well. This can increase the risk for infection. When in doubt call the office.  Surgical incisions should be dressed daily.  If any drainage is noted, use one layer of adaptic, then gauze, Kerlix, and an ace wrap.  Once the incision is completely dry and without drainage, it may be left open to air out.  Showering may begin 36-48 hours later.  Cleaning gently with soap and water.  Traumatic wounds should be dressed daily as well.    One layer of adaptic, gauze, Kerlix, then ace wrap.  The adaptic can be discontinued once the draining has ceased    If you have a wet to dry dressing: wet the gauze with saline the squeeze as much saline out so the gauze is moist (not soaking wet), place moistened gauze over wound, then place a dry gauze over the moist one, followed by Kerlix wrap, then ace wrap.  Information on my medicine - Coumadin   (Warfarin)  This medication education was reviewed with me or my healthcare representative as part of my discharge preparation.  The pharmacist that spoke with me during my hospital stay was:  Arman Filter, [facility]  Why was Coumadin prescribed for you? Coumadin was prescribed for you because you have a blood clot or a medical condition that can cause an increased risk of forming blood clots. Blood clots can cause serious health problems by blocking the flow of blood to the heart, lung, or brain.  Coumadin can prevent harmful blood clots from forming. As a reminder your indication for Coumadin is:   Select from menu  What test will check on my response to Coumadin? While on Coumadin (warfarin) you will need to have an INR test regularly to ensure that your dose is keeping you in the desired range. The INR (international normalized ratio) number is calculated from the result of the laboratory test called prothrombin time (PT).  If an INR APPOINTMENT HAS NOT ALREADY BEEN MADE FOR YOU please schedule an appointment to have this lab work done by your health care provider within 7 days. Your INR goal is usually a number between:  2 to 3 or your provider may give you a more narrow range like 2-2.5.  Ask your health care provider during an office visit what your goal INR is.  What  do you need to  know  About  COUMADIN? Take Coumadin (warfarin) exactly as prescribed by your healthcare provider about the same time each day.  DO NOT stop taking without talking to the doctor who prescribed the medication.  Stopping without other blood clot prevention medication to take the place of Coumadin may increase your risk of developing a new clot or stroke.  Get refills before you run out.  What do you do if you miss a dose? If you miss a dose, take it as soon as you remember on the same day then continue your regularly scheduled regimen the next day.  Do not take two doses of Coumadin at the same time.  Important Safety Information A possible side effect of Coumadin (Warfarin) is an increased risk of bleeding. You should call your healthcare provider right away if you experience any of the following:   Bleeding from an injury or your nose that does not stop.   Unusual colored urine (red or dark brown) or unusual colored stools (red or black).   Unusual bruising for unknown reasons.   A serious fall or if you hit your head (even if there is no bleeding).  Some foods or medicines interact with Coumadin  (warfarin) and might alter your response to warfarin. To help avoid this:   Eat a balanced diet, maintaining a consistent amount of Vitamin K.   Notify your provider about major diet changes you plan to make.   Avoid alcohol or limit your intake to  1 drink for women and 2 drinks for men per day. (1 drink is 5 oz. wine, 12 oz. beer, or 1.5 oz. liquor.)  Make sure that ANY health care provider who prescribes medication for you knows that you are taking Coumadin (warfarin).  Also make sure the healthcare provider who is monitoring your Coumadin knows when you have started a new medication including herbals and non-prescription products.  Coumadin (Warfarin)  Major Drug Interactions  Increased Warfarin Effect Decreased Warfarin Effect  Alcohol (large quantities) Antibiotics (esp. Septra/Bactrim, Flagyl, Cipro) Amiodarone (Cordarone) Aspirin (ASA) Cimetidine (Tagamet) Megestrol (Megace) NSAIDs (ibuprofen, naproxen, etc.) Piroxicam (Feldene) Propafenone (Rythmol SR) Propranolol (Inderal) Isoniazid (INH) Posaconazole (Noxafil) Barbiturates (Phenobarbital) Carbamazepine (Tegretol) Chlordiazepoxide (Librium) Cholestyramine (Questran) Griseofulvin Oral Contraceptives Rifampin Sucralfate (Carafate) Vitamin K   Coumadin (Warfarin) Major Herbal Interactions  Increased Warfarin Effect Decreased Warfarin Effect  Garlic Ginseng Ginkgo biloba Coenzyme Q10 Green tea St. Johns wort    Coumadin (Warfarin) FOOD Interactions  Eat a consistent number of servings per week of foods HIGH in Vitamin K (1 serving =  cup)  Collards (cooked, or boiled & drained) Kale (cooked, or boiled & drained) Mustard greens (cooked, or boiled & drained) Parsley *serving size only =  cup Spinach (cooked, or boiled & drained) Swiss chard (cooked, or boiled & drained) Turnip greens (cooked, or boiled & drained)  Eat a consistent number of servings per week of foods MEDIUM-HIGH in Vitamin K (1 serving = 1  cup)  Asparagus (cooked, or boiled & drained) Broccoli (cooked, boiled & drained, or raw & chopped) Brussel sprouts (cooked, or boiled & drained) *serving size only =  cup Lettuce, raw (green leaf, endive, romaine) Spinach, raw Turnip greens, raw & chopped   These websites have more information on Coumadin (warfarin):  http://www.king-russell.com/; https://www.hines.net/;  Draw Royetta Asal Danae Orleans on 11/25/13 and at least once weekly per protocol.
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
## 2013-11-24 LAB — BASIC METABOLIC PANEL
Anion gap: 16 — ABNORMAL HIGH (ref 5–15)
BUN: 9 mg/dL (ref 6–23)
CO2: 20 meq/L (ref 19–32)
Calcium: 8.2 mg/dL — ABNORMAL LOW (ref 8.4–10.5)
Chloride: 99 mEq/L (ref 96–112)
Creatinine, Ser: 0.58 mg/dL (ref 0.50–1.35)
GFR calc Af Amer: >90 mL/min (ref >90)
GFR calc non Af Amer: >90 mL/min (ref >90)
GLUCOSE: 84 mg/dL (ref 70–99)
POTASSIUM: 4 meq/L (ref 3.7–5.3)
SODIUM: 135 meq/L — AB (ref 137–147)

## 2013-11-24 LAB — PROTIME-INR
INR: 3.28 — AB (ref 0.00–1.49)
PROTHROMBIN TIME: 33.4 s — AB (ref 11.6–15.2)

## 2013-11-24 MED ORDER — WARFARIN SODIUM 2.5 MG PO TABS: 2.5000 mg | ORAL_TABLET | Freq: Every day | ORAL | Status: DC

## 2013-11-24 MED ORDER — LORAZEPAM 0.5 MG PO TABS: 0.5000 mg | ORAL_TABLET | Freq: Three times a day (TID) | ORAL | Status: AC | PRN

## 2013-11-24 MED ORDER — WARFARIN SODIUM 2.5 MG PO TABS: 2.5000 mg | ORAL_TABLET | Freq: Every day | ORAL | Status: AC

## 2013-11-24 MED ORDER — METHOCARBAMOL 500 MG PO TABS: 500.0000 mg | ORAL_TABLET | Freq: Four times a day (QID) | ORAL | Status: AC | PRN
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
## 2013-11-24 NOTE — Progress Notes (Signed)
Patient is medically stable to D/C back to [facility] facility in [place]. Clinical Child psychotherapist (CSW) contacted Pharmacist, community and spoke with Nature conservation officer. Per Dondra Spry patient can come back today to room 109. CSW prepared D/C packet and faxed D/C summary to [facility]. Dondra Spry reported that she received D/C summary. Per weekday CSW an FL2 is not needed. Patient's 2 daughters and son are at bedside. CSW arranged EMS for transport through [facility] in [facility] upon patient and family's request. Jillyn Hidden with [facility] reported that he can pick patient up around 3 to 3:30 this afternoon. CSW made patient and family aware of above. Nursing is aware of above. Please reconsult if future social work needs arise. CSW signing off.   Jetta Lout, LCSWA Weekend CSW [phone]

## 2013-11-24 NOTE — Progress Notes (Signed)
Sutures removed.

## 2013-11-28 NOTE — Op Note (Signed)
[name], Holland NO.:  [id number]  MEDICAL RECORD NO.:  [id number]  LOCATION:  5N22C                        FACILITY:  [facility]  PHYSICIAN:  Doralee Albino. Carola Frost, M.D. DATE OF BIRTH:  10-Feb-1956  DATE OF PROCEDURE:  11/09/2013 DATE OF DISCHARGE:  11/15/2013                              OPERATIVE REPORT   PREOPERATIVE DIAGNOSES: 1. Left acetabular irreducible dislocation. 2. Left acetabular transverse posterior wall fracture. 3. Right calcaneus fracture. 4. Right radial head fracture. 5. Right coronoid fracture.  POSTOPERATIVE DIAGNOSES: 1. Left acetabular irreducible dislocation. 2. Left acetabular transverse posterior wall fracture. 3. Right calcaneus fracture. 4. Right radial head fracture. 5. Right coronoid fracture.  PROCEDURES: 1. Open treatment of left hip dislocation. 2. Open reduction and internal fixation of left transverse posterior     wall acetabular fracture. 3. Application of short leg splint to the right calcaneus.  SURGEON:  Doralee Albino. Carola Frost, M.D.  ASSISTANT:  Mearl Latin, PA-C.  ANESTHESIA:  General.  COMPLICATIONS:  None.  I/O:  In 3362 (crystalloid 2850, colloid 500 mL, packed cells 2 units). Out 2320 (UOP 670, EBL 1600 per Anesthesia).  BRIEF SUMMARY AND INDICATION FOR PROCEDURE:  Jerry Holland is a 58 year old male who sustained a fracture dislocation of the left hip as well as several left upper extremity fractures including a hand fracture, seen and evaluated by Dr. Dairl Ponder.  Also had a right calcaneus fracture.  I attempted to take the patient to the OR yesterday, but was unable to proceed secondary to hyperkalemia.  He now returns with normalized chemistries.  I discussed with the patient and his wife and daughters the risks and benefits of surgical repair including the possibility of infection, nerve injury, vessel injury, DVT, PE, heart attack, stroke, arthritis, loss of motion, heterotopic bone, need  for further surgery, avascular necrosis, and many others.  I also specifically discussed cardiac complications.  They understood these risks and did wish to proceed.  Of note, the patient has been in skeletal traction since the time of his fracture and again unfortunately appears to have incarcerated fragment that precludes a closed reduction. No neurovascular deficits on the right side.  BRIEF SUMMARY OF PROCEDURE:  Jerry Holland was given preoperative antibiotics, taken to the operating room where general anesthesia was induced.  He was positioned with his left side up.  A standard  Kocher- Loanne Drilling approach was made using a 14 cm incision splitting the tensor in line with the incision releasing the short rotators close to their insertion and tagging them with #2 FiberWire stitch.  They were then retracted to expose the dislocation.  This did make the dissection a bit more difficult.  I was unable even with release of the soft tissues to mobilize the hip effectively.  I placed a 6 mm Schanz pin into the proximal femur with the help of my assistant, he was able pull distraction, while I was able to remove the incarcerated segments of bone preventing reduction.  Once this was achieved, I evaluated the joints quite well, irrigated it thoroughly multiple times with bulb syringe as well as pulsatile saline.  I did elevate the minimus off the retroacetabular space, continued  dissection subperiosteally along the ischial spine to the high exiting transverse fracture.  I was able to then place a small drill at the margin of the articular surface to check the femoral head for vascular supply, he did bleed briskly.  There were only some small chondral scrapings on the femoral head without complete loss of the cartilage.  There was associated impaction of the posterior wall and adjacent fragments.  I was able to reduce the transverse fracture using the Jungbluth clamp spreading and compressing and  then securing this with an 8-hole plate along the posterior column.  I was able to squeeze the fractures into compression despite the extension all the way up to the sciatic notch.  Once this was reduced, I then reduced the posterior wall and its adjacent cortical and articular segments. The posterior wall was brought down and secured with K-wires and then the plate was placed over this angling the screws away from the articular surface such that if subsequent total hip arthroplasty were required then no hardware removal would be necessary.  This restored the articular surface and joint incongruity quite nicely as visualized on AP and 2D films, which were taken after both provisional reduction and final hardware placement.  Wounds were irrigated thoroughly and closed in standard layered fashion.  Of note, we did not encounter any significant major caliber blood vessels, but rather oozing from the comminuted exposed fracture site.  Standard layered closure was performed using again #2 FiberWire for the rotators, #1 Vicryl for the tensor, 0-Vicryl, 2-0 Vicryl, and nylon for the skin.  Sterile gently compressive dressing was applied.  Montez Morita, PA-C assisted me throughout and was necessary in order to achieve open reduction of the hip dislocation and also to assist with retraction and simultaneous closure.  Attention was then turned to the right calcaneus where a short-leg well- padded splint was applied.  Skin remained still too swollen for ORIF.  PROGNOSIS:  Jerry Holland' prognosis is limited by the polytrauma with metacarpal fracture on the right, which requires repeat x-rays toward the end of the week as well as elbow bracing most likely for his radial head and coronoid fractures, which are nondisplaced.  The calcaneus will require internal fixation, but only after soft tissues allow.  The patient will be bed to chair for 2-3 months, he will be on pharmacologic DVT prophylaxis with  Lovenox pending definitive surgery for his calcaneus.     Doralee Albino. Carola Frost, M.D.     [facility]  D:  11/27/2013  T:  11/28/2013  Job:  [id number]

## 2014-04-04 ENCOUNTER — Encounter (HOSPITAL_COMMUNITY): Payer: Self-pay | Admitting: Orthopedic Surgery

## 2016-08-06 IMAGING — CR DG OS CALCIS 2+V*R*
3 series · 3 of 3 positions shown · non-contrast
Comparison: Same day.

CLINICAL DATA: Calcaneal fracture.

EXAM:
RIGHT OS CALCIS - 2+ VIEW

[x calcaneus axial right (1 of 2)]
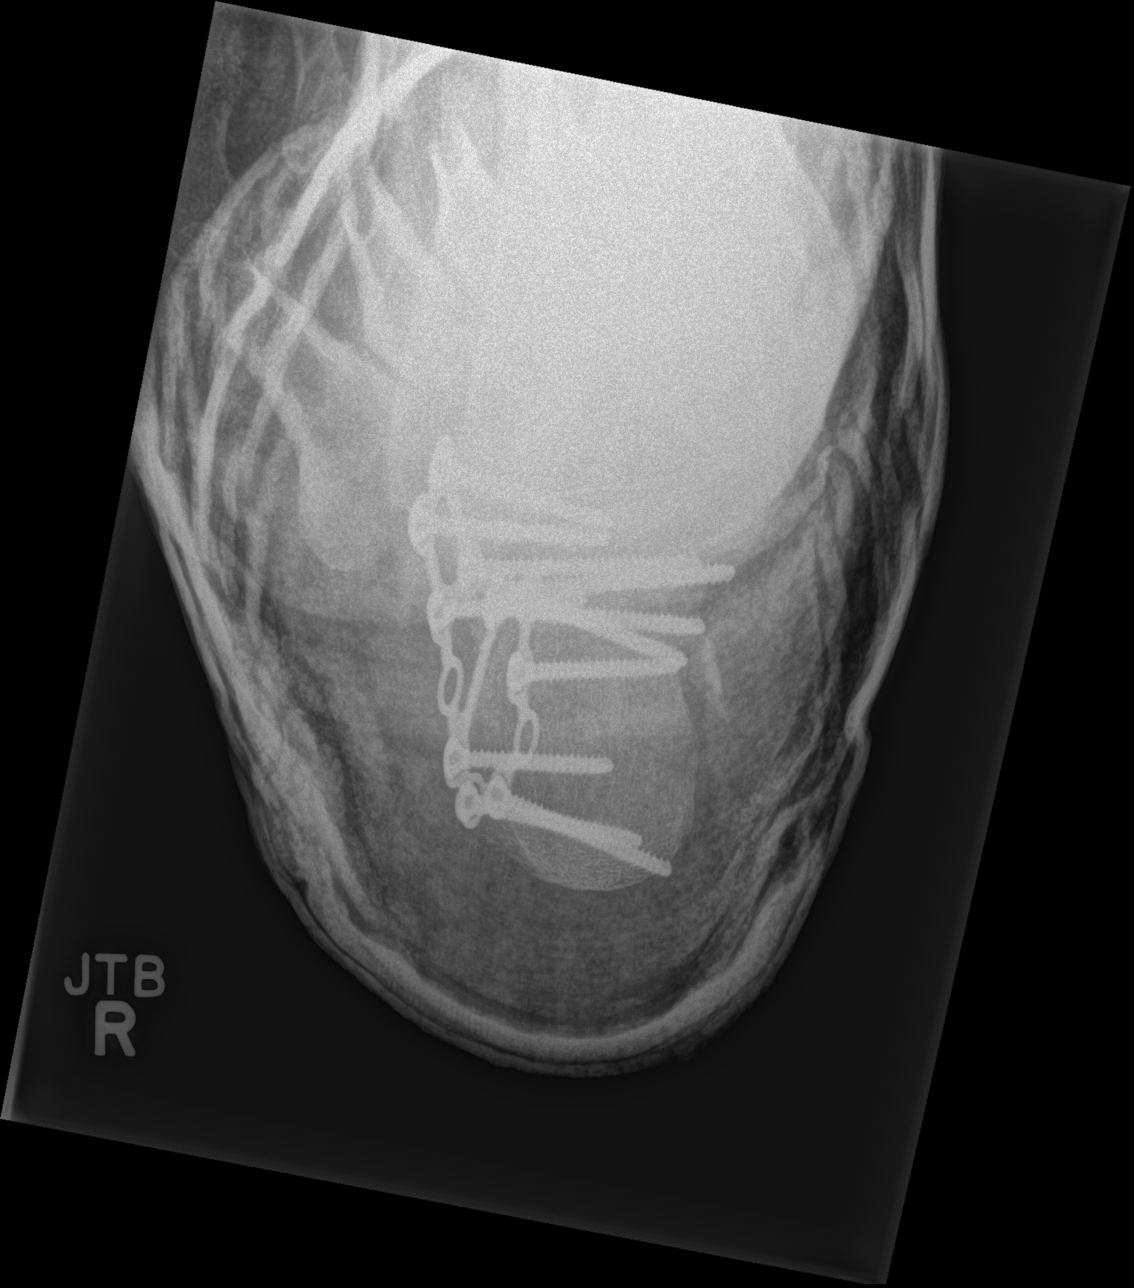

[x calcaneus axial right (2 of 2)]
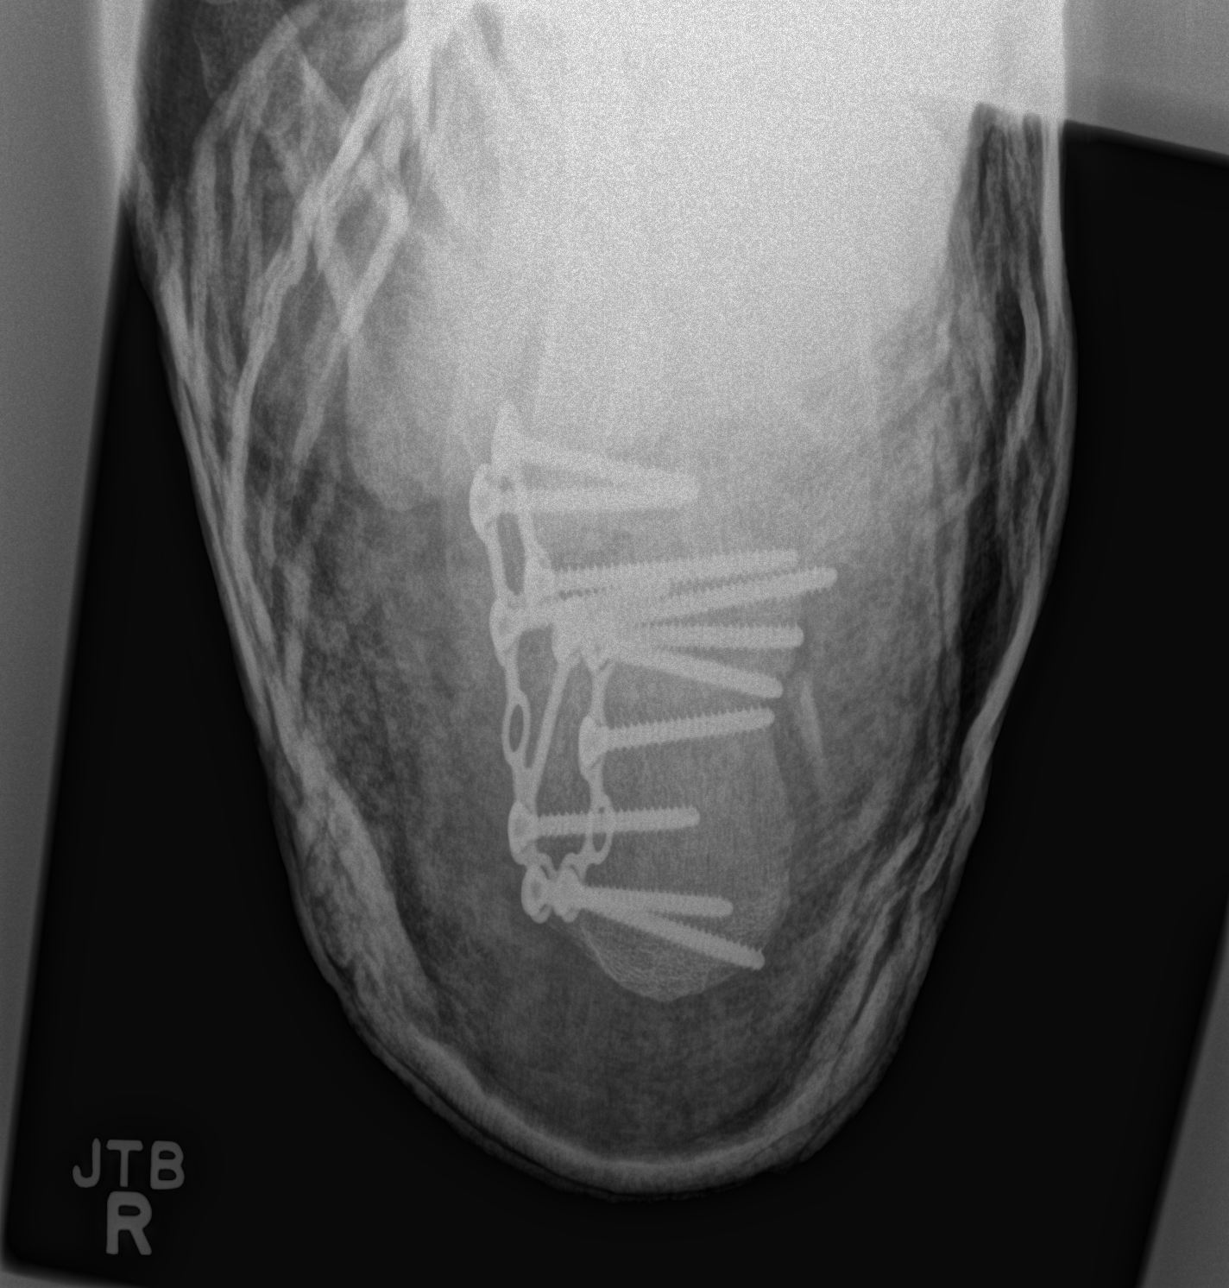

[x calcaneus lat right]
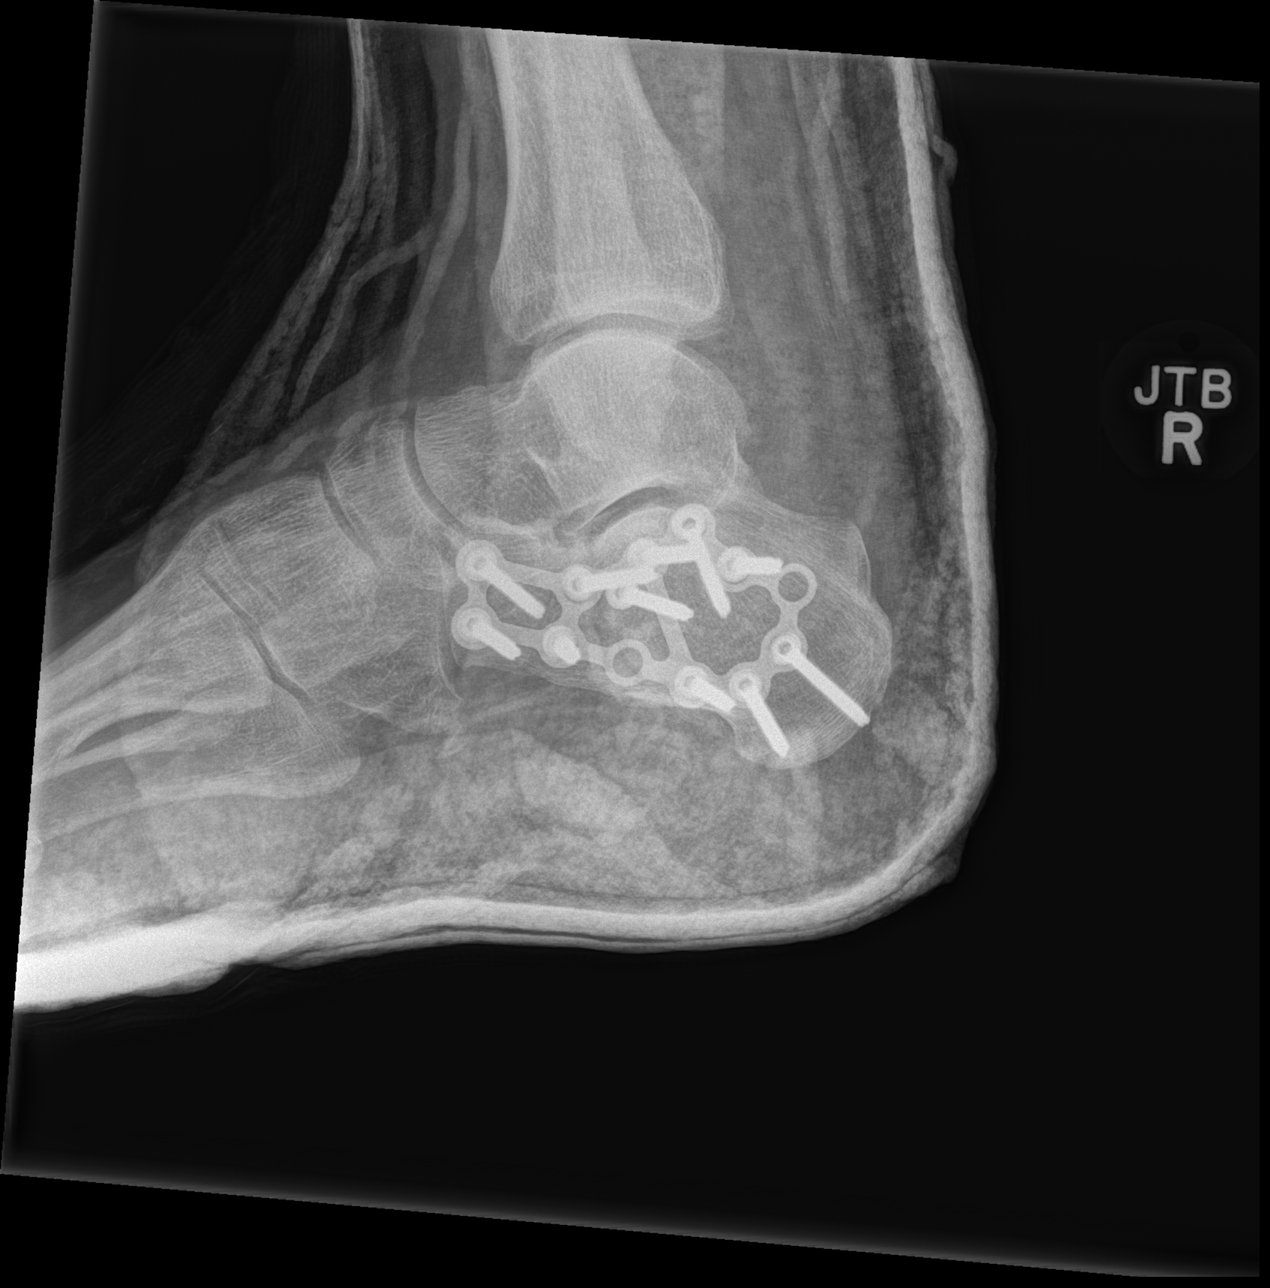

[3 of 3 positions shown; findings below may reference images not displayed]

FINDINGS: The right foot has been casted and immobilized. Status post surgical
internal fixation of calcaneal fracture. Good alignment of fracture
components is noted.
IMPRESSION: Status post internal fixation of calcaneal fracture.

## 2016-08-06 IMAGING — RF DG OS CALCIS 2+V*R*
1 series · 4 of 4 positions shown · non-contrast
Comparison: 11/09/2013

CLINICAL DATA: Postop

EXAM:
RIGHT OS CALCIS - 2+ VIEW; DG C-ARM 61-120 MIN

[Series 1: run · 4 of 4 slices shown]
[im 1/4]
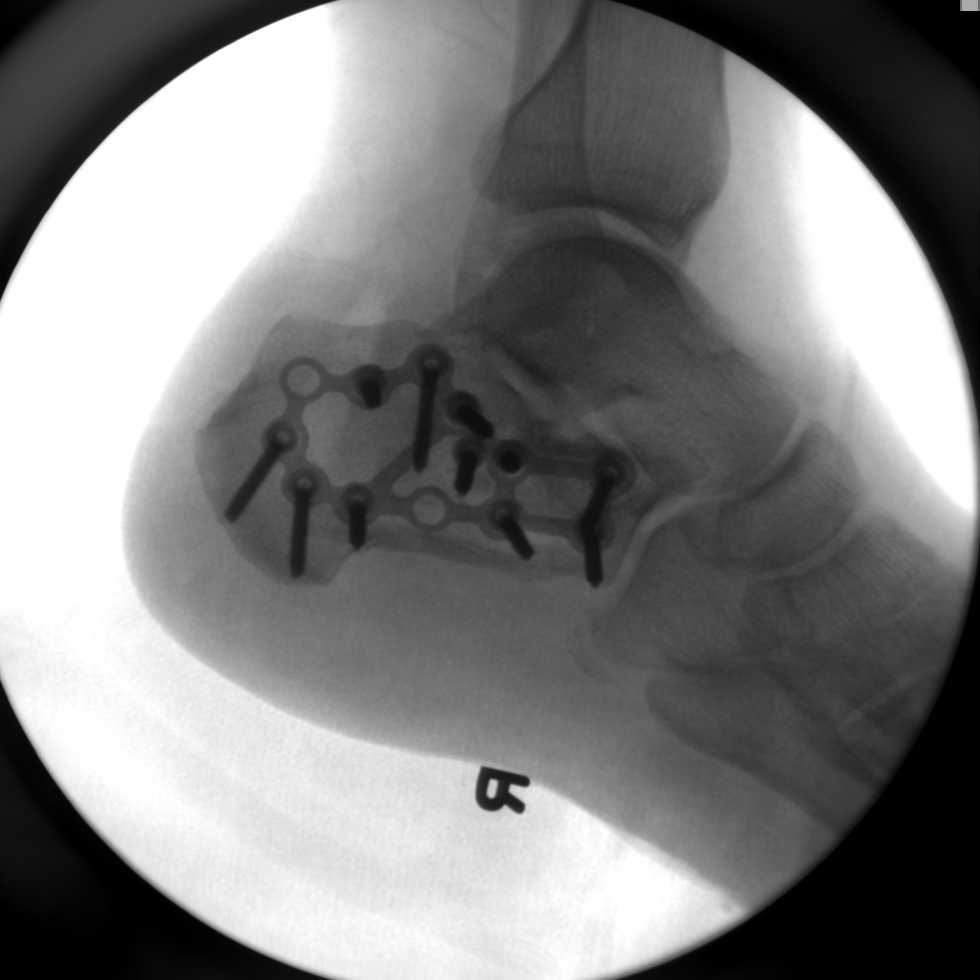
[im 2/4]
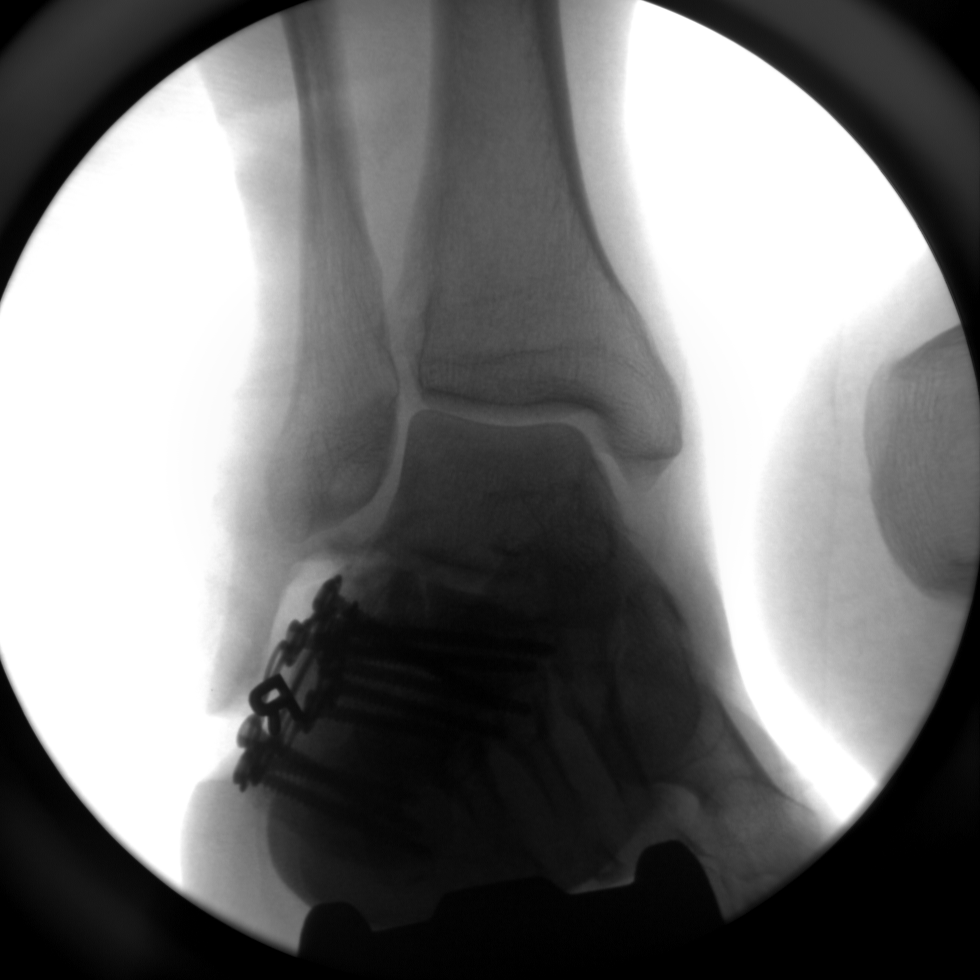
[im 3/4]
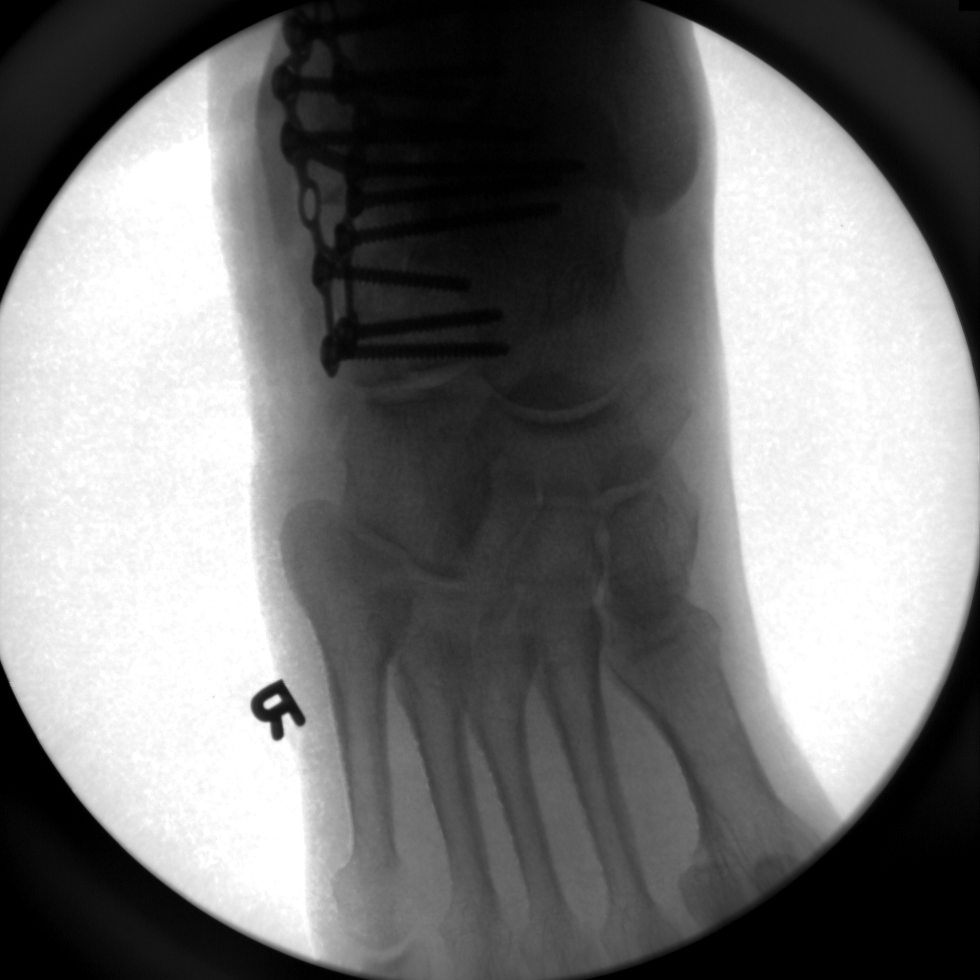
[im 4/4]
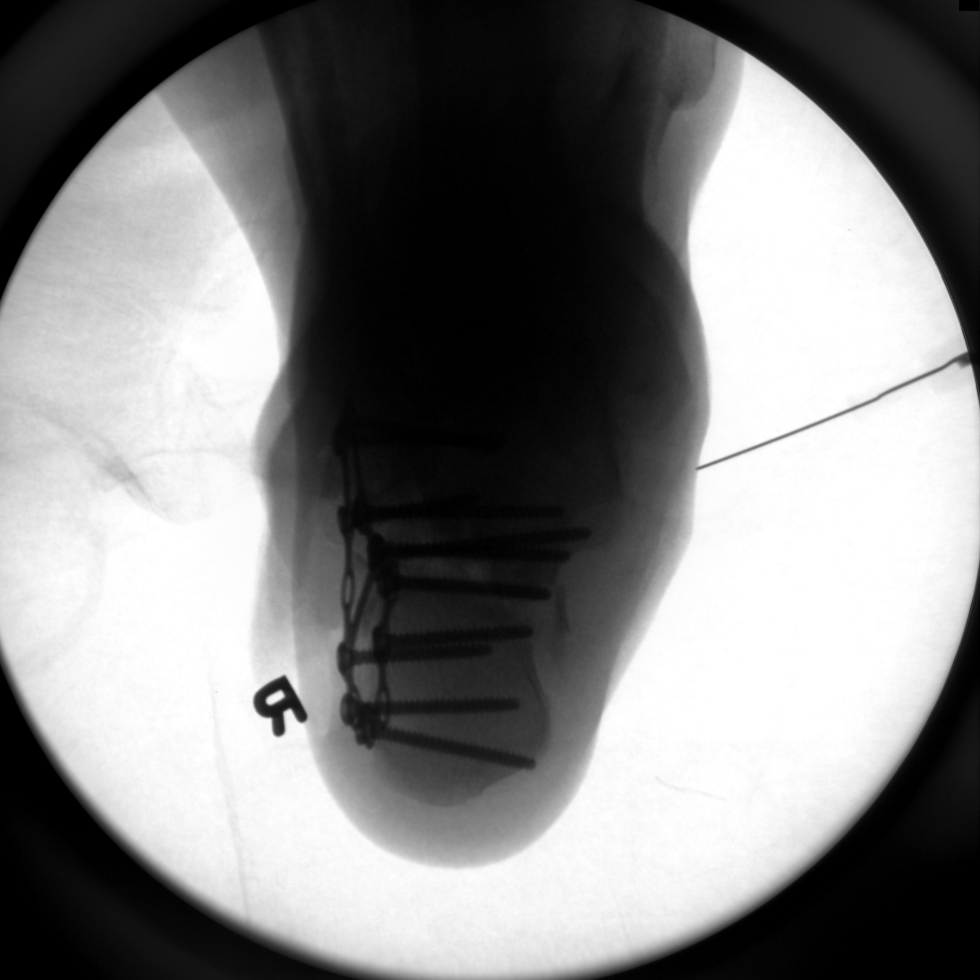

[4 of 4 positions shown; findings below may reference images not displayed]

FINDINGS: The patient is status post intraoperative repair of calcaneus
fracture. Four views submitted for interpretation. Metallic fixation
plate and screws are noted in right calcaneus. There is anatomic
alignment.
IMPRESSION: Status post intraoperative repair of right calcaneus fracture with
metallic fixation plate and screws in anatomic alignment.

Fluoroscopy time was 26 seconds.
